# Patient Record
Sex: Female | Born: 1985
Health system: Southern US, Community
[De-identification: ages and names within clinical notes are randomized; demographics above are authoritative.]

## PROBLEM LIST (undated history)

## (undated) DIAGNOSIS — R4586 Emotional lability: Secondary | ICD-10-CM

## (undated) DIAGNOSIS — C73 Malignant neoplasm of thyroid gland: Secondary | ICD-10-CM

## (undated) DIAGNOSIS — R569 Unspecified convulsions: Secondary | ICD-10-CM

## (undated) DIAGNOSIS — C44721 Squamous cell carcinoma of skin of unspecified lower limb, including hip: Secondary | ICD-10-CM

## (undated) DIAGNOSIS — R490 Dysphonia: Secondary | ICD-10-CM

---
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
## 2010-08-16 ENCOUNTER — Inpatient Hospital Stay (HOSPITAL_COMMUNITY): Admission: AD | Admit: 2010-08-16 | Discharge: 2010-08-19 | Payer: Self-pay | Admitting: Obstetrics and Gynecology

## 2010-12-27 DIAGNOSIS — C44721 Squamous cell carcinoma of skin of unspecified lower limb, including hip: Secondary | ICD-10-CM

## 2010-12-27 HISTORY — DX: Squamous cell carcinoma of skin of unspecified lower limb, including hip: C44.721

## 2011-03-12 LAB — CBC
HCT: 28.3 % — ABNORMAL LOW (ref 36.0–46.0)
MCH: 28.3 pg (ref 26.0–34.0)
MCHC: 33 g/dL (ref 30.0–36.0)
MCV: 85.9 fL (ref 78.0–100.0)
Platelets: 164 10*3/uL (ref 150–400)
Platelets: 215 10*3/uL (ref 150–400)
RBC: 3.28 MIL/uL — ABNORMAL LOW (ref 3.87–5.11)
RDW: 14.2 % (ref 11.5–15.5)
RDW: 14.5 % (ref 11.5–15.5)
WBC: 9.9 10*3/uL (ref 4.0–10.5)

## 2011-03-12 LAB — URIC ACID: Uric Acid, Serum: 7.6 mg/dL — ABNORMAL HIGH (ref 2.4–7.0)

## 2011-03-12 LAB — COMPREHENSIVE METABOLIC PANEL
Alkaline Phosphatase: 222 U/L — ABNORMAL HIGH (ref 39–117)
BUN: 10 mg/dL (ref 6–23)
Calcium: 9.2 mg/dL (ref 8.4–10.5)
Creatinine, Ser: 0.78 mg/dL (ref 0.4–1.2)
Glucose, Bld: 73 mg/dL (ref 70–99)
Potassium: 4.2 mEq/L (ref 3.5–5.1)
Total Protein: 6 g/dL (ref 6.0–8.3)

## 2011-12-28 DIAGNOSIS — C73 Malignant neoplasm of thyroid gland: Secondary | ICD-10-CM

## 2011-12-28 HISTORY — DX: Malignant neoplasm of thyroid gland: C73

## 2012-02-21 ENCOUNTER — Ambulatory Visit (HOSPITAL_COMMUNITY)
Admission: RE | Admit: 2012-02-21 | Discharge: 2012-02-21 | Disposition: A | Discharge status: Home / Self Care | Payer: Medicaid Other | Source: Ambulatory Visit | Attending: Family Medicine | Admitting: Family Medicine

## 2012-02-21 ENCOUNTER — Other Ambulatory Visit: Payer: Self-pay | Admitting: Family Medicine

## 2012-02-21 DIAGNOSIS — E041 Nontoxic single thyroid nodule: Secondary | ICD-10-CM

## 2012-02-21 DIAGNOSIS — E042 Nontoxic multinodular goiter: Secondary | ICD-10-CM

## 2012-02-21 DIAGNOSIS — E049 Nontoxic goiter, unspecified: Secondary | ICD-10-CM | POA: Insufficient documentation

## 2012-02-23 ENCOUNTER — Ambulatory Visit
Admission: RE | Admit: 2012-02-23 | Discharge: 2012-02-23 | Disposition: A | Discharge status: Home / Self Care | Payer: Medicaid Other | Source: Ambulatory Visit | Attending: Family Medicine | Admitting: Family Medicine

## 2012-02-23 ENCOUNTER — Other Ambulatory Visit (HOSPITAL_COMMUNITY)
Admission: RE | Admit: 2012-02-23 | Discharge: 2012-02-23 | Disposition: A | Discharge status: Home / Self Care | Payer: Medicaid Other | Source: Ambulatory Visit | Attending: Interventional Radiology | Admitting: Interventional Radiology

## 2012-02-23 DIAGNOSIS — E042 Nontoxic multinodular goiter: Secondary | ICD-10-CM

## 2012-02-23 DIAGNOSIS — E049 Nontoxic goiter, unspecified: Secondary | ICD-10-CM | POA: Insufficient documentation

## 2012-03-13 ENCOUNTER — Encounter (HOSPITAL_COMMUNITY): Payer: Self-pay | Admitting: Pharmacy Technician

## 2012-03-14 ENCOUNTER — Encounter (HOSPITAL_COMMUNITY)
Admission: RE | Admit: 2012-03-14 | Discharge: 2012-03-14 | Disposition: A | Discharge status: Home / Self Care | Payer: Medicaid Other | Source: Ambulatory Visit | Attending: Otolaryngology | Admitting: Otolaryngology

## 2012-03-14 ENCOUNTER — Encounter (HOSPITAL_COMMUNITY): Payer: Self-pay

## 2012-03-14 HISTORY — DX: Unspecified convulsions: R56.9

## 2012-03-14 HISTORY — DX: Dysphonia: R49.0

## 2012-03-14 HISTORY — DX: Malignant neoplasm of thyroid gland: C73

## 2012-03-14 HISTORY — DX: Squamous cell carcinoma of skin of unspecified lower limb, including hip: C44.721

## 2012-03-14 HISTORY — DX: Emotional lability: R45.86

## 2012-03-14 LAB — SURGICAL PCR SCREEN: MRSA, PCR: NEGATIVE

## 2012-03-14 LAB — CBC
Hemoglobin: 13.5 g/dL (ref 12.0–15.0)
MCH: 30.7 pg (ref 26.0–34.0)
RBC: 4.4 MIL/uL (ref 3.87–5.11)

## 2012-03-14 LAB — HCG, SERUM, QUALITATIVE: Preg, Serum: NEGATIVE

## 2012-03-14 NOTE — Pre-Procedure Instructions (Signed)
[name]  03/14/2012   Your procedure is scheduled on:  Wednesday, March 27  Report to [facility] at 0630 AM.  Call this number if you have problems the morning of surgery: [phone]   Remember:   Do not eat food:After Midnight.  May have clear liquids: up to 4 Hours before arrival.  Clear liquids include soda, tea, black coffee, apple or grape juice, broth.  Take these medicines the morning of surgery with A SIP OF WATER: *Zoloft**   Do not wear jewelry, make-up or nail polish.  Do not wear lotions, powders, or perfumes. You may wear deodorant.  Do not shave 48 hours prior to surgery.  Do not bring valuables to the hospital.  Contacts, dentures or bridgework may not be worn into surgery.  Leave suitcase in the car. After surgery it may be brought to your room.  For patients admitted to the hospital, checkout time is 11:00 AM the day of discharge.   Patients discharged the day of surgery will not be allowed to drive home.     Special Instructions: CHG Shower Use Special Wash: 1/2 bottle night before surgery and 1/2 bottle morning of surgery.   Please read over the following fact sheets that you were given: Pain Booklet, Coughing and Deep Breathing, MRSA Information and Surgical Site Infection Prevention

## 2012-03-14 NOTE — H&P (Signed)
  Emily Smith is an 26 y.o. female.   Chief Complaint: Thyroid cancer  HPI: History of thyroid nodule, FNA positive for papillary carcinoma.  Past Medical History  Diagnosis Date  . Mood changes     takes zoloft  . Thyroid cancer 2013  . Squamous cell skin cancer, thigh 2012    left  . Seizures     as teenager ; none since  . Hoarseness     began 03/11/2012; associated w/ mild sore throat    No past surgical history on file.  Family History  Problem Relation Age of Onset  . Anesthesia problems Neg Hx    Social History:  reports that she has never smoked. She has never used smokeless tobacco. She reports that she drinks about .6 ounces of alcohol per week. She reports that she does not use illicit drugs.  Allergies:  Allergies  Allergen Reactions  . Sulfa Antibiotics Rash    Severe rash    No current facility-administered medications on file as of .   Medications Prior to Admission  Medication Sig Dispense Refill  . sertraline (ZOLOFT) 50 MG tablet Take 50 mg by mouth daily.        Results for orders placed during the hospital encounter of 03/14/12 (from the past 48 hour(s))  CBC     Status: Normal   Collection Time   03/14/12 10:33 AM      Component Value Range Comment   WBC 7.7  4.0 - 10.5 (K/uL)    RBC 4.40  3.87 - 5.11 (MIL/uL)    Hemoglobin 13.5  12.0 - 15.0 (g/dL)    HCT 13.0  86.5 - 78.4 (%)    MCV 90.0  78.0 - 100.0 (fL)    MCH 30.7  26.0 - 34.0 (pg)    MCHC 34.1  30.0 - 36.0 (g/dL)    RDW 69.6  29.5 - 28.4 (%)    Platelets 290  150 - 400 (K/uL)    No results found.  ROS: otherwise negative  There were no vitals taken for this visit.  PHYSICAL EXAM: Overall appearance:  Healthy appearing, in no distress Head:  Normocephalic, atraumatic. Ears: External auditory canals are clear; tympanic membranes are intact and the middle ears are free of any effusion. Nose: External nose is healthy in appearance. Internal nasal exam free of any lesions or  obstruction. Oral Cavity:  There are no mucosal lesions or masses identified. Oral Pharynx/Hypopharynx/Larynx: no signs of any mucosal lesions or masses identified. Vocal cords move normally. Neuro:  No identifiable neurologic deficits. Neck: Thyroid nodule, no other adenopathy.  Studies Reviewed: none    Assessment/Plan Recommend total thyroidectomy.  Orest Dygert 03/14/2012, 11:20 AM

## 2012-03-21 MED ORDER — CEFAZOLIN SODIUM 1-5 GM-% IV SOLN
1.0000 g | INTRAVENOUS | Status: AC
  Administered 2012-03-22: 1 g via INTRAVENOUS
  Filled 2012-03-21: qty 50

## 2012-03-22 ENCOUNTER — Encounter (HOSPITAL_COMMUNITY): Payer: Self-pay | Admitting: Anesthesiology

## 2012-03-22 ENCOUNTER — Ambulatory Visit (HOSPITAL_COMMUNITY): Payer: Medicaid Other | Admitting: Anesthesiology

## 2012-03-22 ENCOUNTER — Encounter (HOSPITAL_COMMUNITY)
Admission: RE | Disposition: A | Discharge status: Home / Self Care | Payer: Self-pay | Source: Ambulatory Visit | Attending: Otolaryngology

## 2012-03-22 ENCOUNTER — Ambulatory Visit (HOSPITAL_COMMUNITY)
Admission: RE | Admit: 2012-03-22 | Discharge: 2012-03-23 | Disposition: A | Discharge status: Home / Self Care | Payer: Medicaid Other | Source: Ambulatory Visit | Attending: Otolaryngology | Admitting: Otolaryngology

## 2012-03-22 ENCOUNTER — Encounter (HOSPITAL_COMMUNITY): Payer: Self-pay | Admitting: General Practice

## 2012-03-22 ENCOUNTER — Encounter (HOSPITAL_COMMUNITY): Payer: Self-pay | Admitting: *Deleted

## 2012-03-22 DIAGNOSIS — Z01812 Encounter for preprocedural laboratory examination: Secondary | ICD-10-CM | POA: Insufficient documentation

## 2012-03-22 DIAGNOSIS — C73 Malignant neoplasm of thyroid gland: Secondary | ICD-10-CM

## 2012-03-22 HISTORY — PX: THYROIDECTOMY: SHX17

## 2012-03-22 HISTORY — PX: OTHER SURGICAL HISTORY: SHX169

## 2012-03-22 HISTORY — PX: NODE DISSECTION: SHX5269

## 2012-03-22 LAB — CALCIUM, IONIZED: Calcium, Ion: 1.19 mmol/L (ref 1.12–1.32)

## 2012-03-22 SURGERY — THYROIDECTOMY
Anesthesia: General | Site: Neck | Wound class: Clean

## 2012-03-22 MED ORDER — ONDANSETRON HCL 4 MG/2ML IJ SOLN: 4.0000 mg | Freq: Four times a day (QID) | INTRAMUSCULAR | Status: DC | PRN

## 2012-03-22 MED ORDER — ROCURONIUM BROMIDE 100 MG/10ML IV SOLN
INTRAVENOUS | Status: DC | PRN
  Administered 2012-03-22: 50 mg via INTRAVENOUS

## 2012-03-22 MED ORDER — HYDROCODONE-ACETAMINOPHEN 5-325 MG PO TABS
1.0000 | ORAL_TABLET | ORAL | Status: DC | PRN
  Administered 2012-03-22 (×2): 2 via ORAL
  Filled 2012-03-22 (×2): qty 2

## 2012-03-22 MED ORDER — LIDOCAINE-EPINEPHRINE 1 %-1:100000 IJ SOLN
INTRAMUSCULAR | Status: DC | PRN
  Administered 2012-03-22: 2 mL

## 2012-03-22 MED ORDER — PROMETHAZINE HCL 25 MG RE SUPP
25.0000 mg | Freq: Four times a day (QID) | RECTAL | Status: DC | PRN
  Filled 2012-03-22: qty 1

## 2012-03-22 MED ORDER — LIDOCAINE HCL 4 % MT SOLN
OROMUCOSAL | Status: DC | PRN
  Administered 2012-03-22: 4 mL via TOPICAL

## 2012-03-22 MED ORDER — PHENYLEPHRINE HCL 10 MG/ML IJ SOLN
INTRAMUSCULAR | Status: DC | PRN
  Administered 2012-03-22: 80 ug via INTRAVENOUS
  Administered 2012-03-22: 40 ug via INTRAVENOUS

## 2012-03-22 MED ORDER — ONDANSETRON HCL 4 MG/2ML IJ SOLN
4.0000 mg | Freq: Four times a day (QID) | INTRAMUSCULAR | Status: DC | PRN
  Administered 2012-03-22: 4 mg via INTRAVENOUS
  Filled 2012-03-22: qty 2

## 2012-03-22 MED ORDER — HYDROCODONE-ACETAMINOPHEN 7.5-500 MG PO TABS: 1.0000 | ORAL_TABLET | Freq: Four times a day (QID) | ORAL | Status: AC | PRN

## 2012-03-22 MED ORDER — PROMETHAZINE HCL 25 MG RE SUPP: 25.0000 mg | Freq: Four times a day (QID) | RECTAL | Status: AC | PRN

## 2012-03-22 MED ORDER — HYDROMORPHONE HCL PF 1 MG/ML IJ SOLN
0.2500 mg | INTRAMUSCULAR | Status: DC | PRN
  Administered 2012-03-22 (×3): 0.25 mg via INTRAVENOUS

## 2012-03-22 MED ORDER — IBUPROFEN 400 MG PO TABS
400.0000 mg | ORAL_TABLET | Freq: Four times a day (QID) | ORAL | Status: DC | PRN
  Administered 2012-03-22: 400 mg via ORAL
  Filled 2012-03-22: qty 1

## 2012-03-22 MED ORDER — GLYCOPYRROLATE 0.2 MG/ML IJ SOLN
INTRAMUSCULAR | Status: DC | PRN
  Administered 2012-03-22: 0.6 mg via INTRAVENOUS

## 2012-03-22 MED ORDER — NEOSTIGMINE METHYLSULFATE 1 MG/ML IJ SOLN
INTRAMUSCULAR | Status: DC | PRN
  Administered 2012-03-22: 4 mg via INTRAVENOUS

## 2012-03-22 MED ORDER — LEVOTHYROXINE SODIUM 100 MCG PO TABS: 100.0000 ug | ORAL_TABLET | Freq: Every day | ORAL | Status: DC

## 2012-03-22 MED ORDER — DEXTROSE-NACL 5-0.9 % IV SOLN
INTRAVENOUS | Status: DC
  Administered 2012-03-22: 75 mL/h via INTRAVENOUS

## 2012-03-22 MED ORDER — STERILE WATER FOR IRRIGATION IR SOLN
Status: DC | PRN
  Administered 2012-03-22: 1000 mL

## 2012-03-22 MED ORDER — SERTRALINE HCL 50 MG PO TABS
50.0000 mg | ORAL_TABLET | Freq: Every day | ORAL | Status: DC
  Administered 2012-03-22: 50 mg via ORAL
  Filled 2012-03-22 (×3): qty 1

## 2012-03-22 MED ORDER — MIDAZOLAM HCL 5 MG/5ML IJ SOLN
INTRAMUSCULAR | Status: DC | PRN
  Administered 2012-03-22: 2 mg via INTRAVENOUS

## 2012-03-22 MED ORDER — LEVOTHYROXINE SODIUM 100 MCG PO TABS
100.0000 ug | ORAL_TABLET | Freq: Every day | ORAL | Status: DC
  Administered 2012-03-23: 100 ug via ORAL
  Filled 2012-03-22 (×3): qty 1

## 2012-03-22 MED ORDER — 0.9 % SODIUM CHLORIDE (POUR BTL) OPTIME
TOPICAL | Status: DC | PRN
  Administered 2012-03-22: 1000 mL

## 2012-03-22 MED ORDER — PROPOFOL 10 MG/ML IV EMUL
INTRAVENOUS | Status: DC | PRN
  Administered 2012-03-22: 200 mg via INTRAVENOUS

## 2012-03-22 MED ORDER — ONDANSETRON HCL 4 MG/2ML IJ SOLN
INTRAMUSCULAR | Status: DC | PRN
  Administered 2012-03-22: 4 mg via INTRAVENOUS

## 2012-03-22 MED ORDER — PROMETHAZINE HCL 25 MG PO TABS
25.0000 mg | ORAL_TABLET | Freq: Four times a day (QID) | ORAL | Status: DC | PRN
  Administered 2012-03-22: 25 mg via ORAL
  Filled 2012-03-22: qty 1

## 2012-03-22 MED ORDER — ARTIFICIAL TEARS OP OINT
TOPICAL_OINTMENT | OPHTHALMIC | Status: DC | PRN
  Administered 2012-03-22: 1 via OPHTHALMIC

## 2012-03-22 MED ORDER — HYDROCODONE-ACETAMINOPHEN 7.5-500 MG PO TABS: 1.0000 | ORAL_TABLET | Freq: Four times a day (QID) | ORAL | Status: DC | PRN

## 2012-03-22 MED ORDER — MORPHINE SULFATE 2 MG/ML IJ SOLN
2.0000 mg | INTRAMUSCULAR | Status: DC | PRN
  Administered 2012-03-22 (×2): 1 mg via INTRAVENOUS
  Filled 2012-03-22 (×2): qty 1

## 2012-03-22 MED ORDER — FENTANYL CITRATE 0.05 MG/ML IJ SOLN
INTRAMUSCULAR | Status: DC | PRN
  Administered 2012-03-22: 100 ug via INTRAVENOUS
  Administered 2012-03-22: 50 ug via INTRAVENOUS

## 2012-03-22 MED ORDER — LACTATED RINGERS IV SOLN
INTRAVENOUS | Status: DC | PRN
  Administered 2012-03-22 (×2): via INTRAVENOUS

## 2012-03-22 SURGICAL SUPPLY — 48 items
APPLIER CLIP 9.375 SM OPEN (CLIP)
ATTRACTOMAT 16X20 MAGNETIC DRP (DRAPES) ×3 IMPLANT
CANISTER SUCTION 2500CC (MISCELLANEOUS) ×3 IMPLANT
CLEANER TIP ELECTROSURG 2X2 (MISCELLANEOUS) ×3 IMPLANT
CLIP APPLIE 9.375 SM OPEN (CLIP) IMPLANT
CLOTH BEACON ORANGE TIMEOUT ST (SAFETY) ×3 IMPLANT
CONT SPEC 4OZ CLIKSEAL STRL BL (MISCELLANEOUS) IMPLANT
CORDS BIPOLAR (ELECTRODE) ×3 IMPLANT
COVER SURGICAL LIGHT HANDLE (MISCELLANEOUS) ×3 IMPLANT
DECANTER SPIKE VIAL GLASS SM (MISCELLANEOUS) ×3 IMPLANT
DERMABOND ADVANCED (GAUZE/BANDAGES/DRESSINGS) ×1
DERMABOND ADVANCED .7 DNX12 (GAUZE/BANDAGES/DRESSINGS) ×2 IMPLANT
DRAIN JACKSON RD 7FR 3/32 (WOUND CARE) IMPLANT
DRAIN SNY 10 ROU (WOUND CARE) ×3 IMPLANT
ELECT COATED BLADE 2.86 ST (ELECTRODE) ×3 IMPLANT
ELECT REM PT RETURN 9FT ADLT (ELECTROSURGICAL) ×3
ELECTRODE REM PT RTRN 9FT ADLT (ELECTROSURGICAL) ×2 IMPLANT
EVACUATOR SILICONE 100CC (DRAIN) ×3 IMPLANT
GAUZE SPONGE 4X4 16PLY XRAY LF (GAUZE/BANDAGES/DRESSINGS) ×6 IMPLANT
GLOVE BIO SURGEON STRL SZ 6.5 (GLOVE) ×3 IMPLANT
GLOVE BIOGEL PI IND STRL 7.0 (GLOVE) ×2 IMPLANT
GLOVE BIOGEL PI INDICATOR 7.0 (GLOVE) ×1
GLOVE ECLIPSE 6.5 STRL STRAW (GLOVE) ×3 IMPLANT
GLOVE ECLIPSE 7.5 STRL STRAW (GLOVE) ×3 IMPLANT
GLOVE SURG SS PI 6.5 STRL IVOR (GLOVE) ×3 IMPLANT
GOWN STRL NON-REIN LRG LVL3 (GOWN DISPOSABLE) ×9 IMPLANT
KIT BASIN OR (CUSTOM PROCEDURE TRAY) ×3 IMPLANT
KIT ROOM TURNOVER OR (KITS) ×3 IMPLANT
NEEDLE 27GAX1X1/2 (NEEDLE) ×3 IMPLANT
NS IRRIG 1000ML POUR BTL (IV SOLUTION) ×3 IMPLANT
PAD ARMBOARD 7.5X6 YLW CONV (MISCELLANEOUS) ×6 IMPLANT
PENCIL FOOT CONTROL (ELECTRODE) ×3 IMPLANT
SPECIMEN JAR MEDIUM (MISCELLANEOUS) IMPLANT
SPONGE INTESTINAL PEANUT (DISPOSABLE) IMPLANT
STAPLER VISISTAT 35W (STAPLE) ×3 IMPLANT
SUT CHROMIC 3 0 SH 27 (SUTURE) IMPLANT
SUT CHROMIC 4 0 PS 2 18 (SUTURE) ×3 IMPLANT
SUT ETHILON 3 0 PS 1 (SUTURE) IMPLANT
SUT ETHILON 5 0 P 3 18 (SUTURE) ×1
SUT NYLON ETHILON 5-0 P-3 1X18 (SUTURE) ×2 IMPLANT
SUT SILK 2 0 FS (SUTURE) ×3 IMPLANT
SUT SILK 3 0 REEL (SUTURE) IMPLANT
SUT SILK 4 0 REEL (SUTURE) ×9 IMPLANT
TOWEL OR 17X24 6PK STRL BLUE (TOWEL DISPOSABLE) ×3 IMPLANT
TOWEL OR 17X26 10 PK STRL BLUE (TOWEL DISPOSABLE) ×3 IMPLANT
TOWEL OR NON WOVEN STRL DISP B (DISPOSABLE) ×3 IMPLANT
TRAY ENT MC OR (CUSTOM PROCEDURE TRAY) ×3 IMPLANT
WATER STERILE IRR 1000ML POUR (IV SOLUTION) ×3 IMPLANT

## 2012-03-22 NOTE — Anesthesia Preprocedure Evaluation (Signed)
Anesthesia Evaluation  Patient identified by MRN, date of birth, ID band Patient awake    Reviewed: Allergy & Precautions, H&P , NPO status , Patient's Chart, lab work & pertinent test results  Airway Mallampati: II  Neck ROM: full    Dental   Pulmonary          Cardiovascular     Neuro/Psych Seizures -,     GI/Hepatic   Endo/Other    Renal/GU      Musculoskeletal   Abdominal   Peds  Hematology   Anesthesia Other Findings   Reproductive/Obstetrics                           Anesthesia Physical Anesthesia Plan  ASA: II  Anesthesia Plan: General   Post-op Pain Management:    Induction: Intravenous  Airway Management Planned: Oral ETT  Additional Equipment:   Intra-op Plan:   Post-operative Plan: Extubation in OR  Informed Consent: I have reviewed the patients History and Physical, chart, labs and discussed the procedure including the risks, benefits and alternatives for the proposed anesthesia with the patient or authorized representative who has indicated his/her understanding and acceptance.     Plan Discussed with: CRNA and Surgeon  Anesthesia Plan Comments:         Anesthesia Quick Evaluation

## 2012-03-22 NOTE — Transfer of Care (Signed)
Immediate Anesthesia Transfer of Care Note  Patient: Emily Smith  Procedure(s) Performed: Procedure(s) (LRB): THYROIDECTOMY (Bilateral) NODE DISSECTION (N/A)  Patient Location: PACU  Anesthesia Type: General  Level of Consciousness: awake, alert  and oriented  Airway & Oxygen Therapy: Patient Spontanous Breathing and Patient connected to nasal cannula oxygen  Post-op Assessment: Report given to PACU RN and Patient moving all extremities  Post vital signs: Reviewed  Complications: No apparent anesthesia complications

## 2012-03-22 NOTE — Progress Notes (Signed)
Report given to elise rn as caregiver 

## 2012-03-22 NOTE — Preoperative (Signed)
Beta Blockers   Reason not to administer Beta Blockers:Not Applicable 

## 2012-03-22 NOTE — Discharge Instructions (Signed)
You may shower and use soap and water. Do not using the cream or ointment on the incision.

## 2012-03-22 NOTE — Op Note (Signed)
OPERATIVE REPORT  DATE OF SURGERY: 03/22/2012  PATIENT:  Emily Smith,  26 y.o. female  PRE-OPERATIVE DIAGNOSIS:  Thyroid Cancer  POST-OPERATIVE DIAGNOSIS:  Thyroid Cancer  PROCEDURE:  Procedure(s): THYROIDECTOMY NODE DISSECTION  SURGEON:  Susy Frizzle, MD  ASSISTANTS: Beckey Rutter, PA   ANESTHESIA:   general  EBL:  20 ml  DRAINS: (10 [place]) Jackson-Pratt drain(s) with closed bulb suction in the Neck   LOCAL MEDICATIONS USED:  XYLOCAINE   SPECIMEN:  Source of Specimen:  Total thyroidectomy and a central node compartments dissection  COUNTS:  YES  PROCEDURE DETAILS: Patient was taken to the operating room and placed on the operating table in the supine position. Following induction of general endotracheal anesthesia a shoulder roll was placed  beneath the shoulder blades. Cervical landmarks were identified. A transverse incision was outlined about 2 fingerbreadths above the sternal notch. A marking pen was used to mark the incision. Xylocaine with epinephrine was infiltrated and a 15 scalpel was used to incise the skin and subcutaneous tissue. Superficial fascia was divided. A subplatysmal flap was elevated superiorly and inferiorly allowing sufficient exposure of the thyroid gland that was palpable. The midline fascia was divided using electrocautery. Self-retaining retractors were used one on each side. The thyroid gland was identified. The right side was dissected first. The superior fold was brought inferiorly and toward the left side as the superior pole vessels were separately identified, ligated between clamps and divided. As the gland was brought forward, additional vessels were ligated in a similar fashion. The recurrent nerve was identified. The superior parathyroid was identified and preserved with its blood supply. The middle thyroid gland was then ligated as well. The inferior vasculature was identified and ligated in a similar fashion. The gland was brought  forward off of the trachea as the ligament of Allyson Sabal was divided. The left side was then dissected in a similar fashion. The right side had a 1-1/2 cm firm nodule close to the isthmus. The left side had a 2-1/2 cm nodule further laterally and slightly superiorly in the gland. The left side was dissected in a very similar fashion and the recurrent nerve was identified on the left. Parathyroid was not separately identified. The specimen was removed and sent for pathologic evaluation. A single silk suture was used to mark the left lobe.  Central node dissection: There are multiple palpable lymph nodes in the central node compartment. This was dissected by identifying the recurrent nerve following it down towards the thoracic inlet, and removing all the soft tissue between the nerves. The dissection was completed down to the trachea and the specimen was removed and sent for pathologic evaluation. The wound was irrigated with saline. Hemostasis was completed using additional ties and bipolar cautery as needed. The drain was left in the wound exiting through the right side of the incision and secured in place with a nylon suture. The midline fascia was reapproximated with chromic suture. The platysma layer and a subcuticular layer were also reapproximated with chromic suture. Dermabond was used on the skin. The patient was then awakened, extubated and transferred to recovery in stable condition.   PATIENT DISPOSITION:  PACU - hemodynamically stable.

## 2012-03-22 NOTE — Anesthesia Postprocedure Evaluation (Signed)
Anesthesia Post Note  Patient: Emily Smith  Procedure(s) Performed: Procedure(s) (LRB): THYROIDECTOMY (Bilateral) NODE DISSECTION (N/A)  Anesthesia type: General  Patient location: PACU  Post pain: Pain level controlled and Adequate analgesia  Post assessment: Post-op Vital signs reviewed, Patient's Cardiovascular Status Stable, Respiratory Function Stable, Patent Airway and Pain level controlled  Last Vitals:  Filed Vitals:   03/22/12 1145  BP: 123/68  Pulse: 65  Temp: 36.6 C  Resp: 16    Post vital signs: Reviewed and stable  Level of consciousness: awake, alert  and oriented  Complications: No apparent anesthesia complications

## 2012-03-22 NOTE — Interval H&P Note (Signed)
History and Physical Interval Note:  03/22/2012 8:22 AM  Emily Smith  has presented today for surgery, with the diagnosis of thyroid cancer  The various methods of treatment have been discussed with the patient and family. After consideration of risks, benefits and other options for treatment, the patient has consented to  Procedure(s) (LRB): THYROIDECTOMY (Bilateral) as a surgical intervention .  The patients' history has been reviewed, patient examined, no change in status, stable for surgery.  I have reviewed the patients' chart and labs.  Questions were answered to the patient's satisfaction.     Kandance Yano

## 2012-03-22 NOTE — Progress Notes (Signed)
She is doing well. She is taking fluids but having a slight cough with them. Her voice is very weak. No breathing problems. Her ionized calcium was 1.19 Her wound looks excellent. The drain is working well. No evidence of infection or hematoma. Continued care and followup in the morning by Dr. Pollyann Kennedy.

## 2012-03-23 ENCOUNTER — Encounter (HOSPITAL_COMMUNITY): Payer: Self-pay | Admitting: Otolaryngology

## 2012-03-23 LAB — CALCIUM, IONIZED
Calcium, Ion: 1.11 mmol/L — ABNORMAL LOW (ref 1.12–1.32)
Calcium, Ion: 1.11 mmol/L — ABNORMAL LOW (ref 1.12–1.32)
Calcium, Ion: 1.19 mmol/L (ref 1.12–1.32)

## 2012-03-23 NOTE — Progress Notes (Signed)
Patient ID: Emily Smith, female   DOB: Jun 05, 1986, 26 y.o.   MRN: [id number] Subjective: She is doing well, able to swallow, but her voice is very weak and she has a weak cough. Pain is under control. She denies any cramping or twitching and no perioral tingling.  Objective: Vital signs in last 24 hours: Temp:  [97.8 F (36.6 C)-99 F (37.2 C)] 98.4 F (36.9 C) (03/28 0554) Pulse Rate:  [64-86] 72  (03/28 0554) Resp:  [13-22] 18  (03/28 0554) BP: (91-127)/(51-81) 103/59 mmHg (03/28 0554) SpO2:  [95 %-100 %] 95 % (03/28 0554) Weight:  [154 lb 9.6 oz (70.126 kg)] 154 lb 9.6 oz (70.126 kg) (03/27 1604) Weight change:  Last BM Date: 03/22/12  Intake/Output from previous day: 03/27 0701 - 03/28 0700 In: 2116.3 [P.O.:480; I.V.:1636.3] Out: 660 [Urine:600; Drains:45; Blood:15] Intake/Output this shift:    PHYSICAL EXAM: Neck looks excellent, drain removed. Voice very breathy. Cough is weak. Kvostek's negative.  Lab Results: No results found for this basename: WBC:2,HGB:2,HCT:2,PLT:2 in the last 72 hours BMET No results found for this basename: NA:2,K:2,CL:2,CO2:2,GLUCOSE:2,BUN:2,CREATININE:2,CALCIUM:2 in the last 72 hours  Studies/Results: No results found.  Medications: I have reviewed the patient's current medications.  Assessment/Plan: Post op day one, doing well. Likely recurrent nerve palsy, should be temporary. Calcium dropped a little. Awaiting the most recent one from this morning. No symptoms. Will discharge home if calcium stable.  LOS: 1 day   Tonia Avino 03/23/2012, 8:30 AM

## 2012-03-23 NOTE — Progress Notes (Signed)
Patient discharged home, instructions given and verbalized understanding, escorted by family

## 2012-03-23 NOTE — Progress Notes (Signed)
Patient complaining of difficulty swallowing thin liquids, patient stated she is not in pain and that she swallowed well with apple sauce. Noted patient coughing after trying to drink any fluids, MD paged thru clinic number, still waiting for a call.

## 2012-03-23 NOTE — Discharge Summary (Signed)
  Physician Discharge Summary  Patient ID: Emily Smith MRN: [id number] DOB/AGE: Jun 03, 1986 [age] y.o.  Admit date: 03/22/2012 Discharge date: 03/23/2012  Admission Diagnoses:thyroid cancer  Discharge Diagnoses:  Active Problems:  * No active hospital problems. *    Discharged Condition: good  Hospital Course: Total thyroidectomy, central node dissection. 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 Calcium stable.   Consults: None  Significant Diagnostic Studies: labs: calcium levels  Treatments: surgery: thyroidectomy and nodal dissection  Discharge Exam: Blood pressure 103/59, pulse 72, temperature 98.4 F (36.9 C), temperature source Oral, resp. rate 18, height 5\' 6"  (1.676 m), weight 154 lb 9.6 oz (70.126 kg), last menstrual period 12/10/2009, SpO2 95.00%. PHYSICAL EXAM: JP removed, no swelling. Voice weak.  Disposition:   Discharge Orders    Future Orders Please Complete By Expires   Diet - low sodium heart healthy      Comments:   Minimize thin liquids. It might help to tuck your chin against your chest when you drink.   Increase activity slowly        Medication List  As of 03/23/2012  1:07 PM   TAKE these medications         HYDROcodone-acetaminophen 7.5-500 MG per tablet   Commonly known as: LORTAB   Take 1 tablet by mouth every 6 (six) hours as needed for pain.      levothyroxine 100 MCG tablet   Commonly known as: SYNTHROID, LEVOTHROID   Take 1 tablet (100 mcg total) by mouth daily.      promethazine 25 MG suppository   Commonly known as: PHENERGAN   Place 1 suppository (25 mg total) rectally every 6 (six) hours as needed for nausea.      sertraline 50 MG tablet   Commonly known as: ZOLOFT   Take 50 mg by mouth daily.           Follow-up Information    Follow up with Serena Colonel, MD. Call in 1 week.   Contact information:   [address], [address] [place]  [address] [phone]          Signed: Serena Colonel 03/23/2012, 1:07 PM

## 2012-05-24 ENCOUNTER — Other Ambulatory Visit: Payer: Self-pay | Admitting: Endocrinology

## 2012-05-24 DIAGNOSIS — C73 Malignant neoplasm of thyroid gland: Secondary | ICD-10-CM

## 2012-06-05 ENCOUNTER — Encounter (HOSPITAL_COMMUNITY)
Admission: RE | Admit: 2012-06-05 | Discharge: 2012-06-05 | Disposition: A | Discharge status: Home / Self Care | Payer: Medicaid Other | Source: Ambulatory Visit | Attending: Endocrinology | Admitting: Endocrinology

## 2012-06-05 ENCOUNTER — Inpatient Hospital Stay (HOSPITAL_COMMUNITY): Admission: RE | Admit: 2012-06-05 | Payer: Medicaid Other | Source: Ambulatory Visit

## 2012-06-05 DIAGNOSIS — C73 Malignant neoplasm of thyroid gland: Secondary | ICD-10-CM | POA: Insufficient documentation

## 2012-06-05 MED ORDER — THYROTROPIN ALFA 1.1 MG IM SOLR
0.9000 mg | INTRAMUSCULAR | Status: DC
  Filled 2012-06-05: qty 0.9

## 2012-06-06 ENCOUNTER — Ambulatory Visit (HOSPITAL_COMMUNITY)
Admission: RE | Admit: 2012-06-06 | Discharge: 2012-06-06 | Disposition: A | Discharge status: Home / Self Care | Payer: Medicaid Other | Source: Ambulatory Visit | Attending: Endocrinology | Admitting: Endocrinology

## 2012-06-06 DIAGNOSIS — E0789 Other specified disorders of thyroid: Secondary | ICD-10-CM | POA: Insufficient documentation

## 2012-06-06 DIAGNOSIS — C73 Malignant neoplasm of thyroid gland: Secondary | ICD-10-CM | POA: Insufficient documentation

## 2012-06-06 MED ORDER — THYROTROPIN ALFA 1.1 MG IM SOLR
0.9000 mg | INTRAMUSCULAR | Status: DC
  Filled 2012-06-06: qty 0.9

## 2012-06-07 ENCOUNTER — Encounter (HOSPITAL_COMMUNITY)
Admission: RE | Admit: 2012-06-07 | Discharge: 2012-06-07 | Disposition: A | Discharge status: Home / Self Care | Payer: Medicaid Other | Source: Ambulatory Visit | Attending: Endocrinology | Admitting: Endocrinology

## 2012-06-07 LAB — HCG, SERUM, QUALITATIVE: Preg, Serum: NEGATIVE

## 2012-06-07 MED ORDER — SODIUM IODIDE I 131 CAPSULE
132.3000 | Freq: Once | INTRAVENOUS | Status: AC | PRN
  Administered 2012-06-07: 132.3 via ORAL

## 2012-06-12 ENCOUNTER — Encounter (HOSPITAL_COMMUNITY): Payer: Medicaid Other

## 2012-06-14 ENCOUNTER — Encounter (HOSPITAL_COMMUNITY): Payer: Self-pay

## 2012-06-14 ENCOUNTER — Encounter (HOSPITAL_COMMUNITY)
Admission: RE | Admit: 2012-06-14 | Discharge: 2012-06-14 | Disposition: A | Discharge status: Home / Self Care | Payer: Medicaid Other | Source: Ambulatory Visit | Attending: Endocrinology | Admitting: Endocrinology

## 2012-06-14 DIAGNOSIS — C73 Malignant neoplasm of thyroid gland: Secondary | ICD-10-CM | POA: Insufficient documentation

## 2013-05-02 ENCOUNTER — Other Ambulatory Visit (INDEPENDENT_AMBULATORY_CARE_PROVIDER_SITE_OTHER): Payer: Medicaid Other

## 2013-05-02 DIAGNOSIS — C73 Malignant neoplasm of thyroid gland: Secondary | ICD-10-CM

## 2013-05-02 LAB — THYROID PANEL WITH TSH: Free Thyroxine Index: 5 — ABNORMAL HIGH (ref 1.0–3.9)

## 2013-05-02 NOTE — Addendum Note (Signed)
Addended by: Orma Render F on: 05/02/2013 02:37 PM   Modules accepted: Orders

## 2013-05-02 NOTE — Progress Notes (Signed)
Patient came in for labs only.

## 2013-05-03 LAB — THYROGLOBULIN ANTIBODY: Thyroglobulin Ab: <20 U/mL (ref <40.0)

## 2013-06-23 IMAGING — US US SOFT TISSUE HEAD/NECK
1 series · 14 of 25 positions shown · non-contrast
Comparison: None.

CLINICAL DATA: Thyroid nodule

THYROID ULTRASOUND
TECHNIQUE: Ultrasound examination of the thyroid gland and adjacent
soft tissues was performed.

[Series 1: us soft tissue head/neck · 0.08mm/px · 14 of 38 slices shown]
[im 1/38]
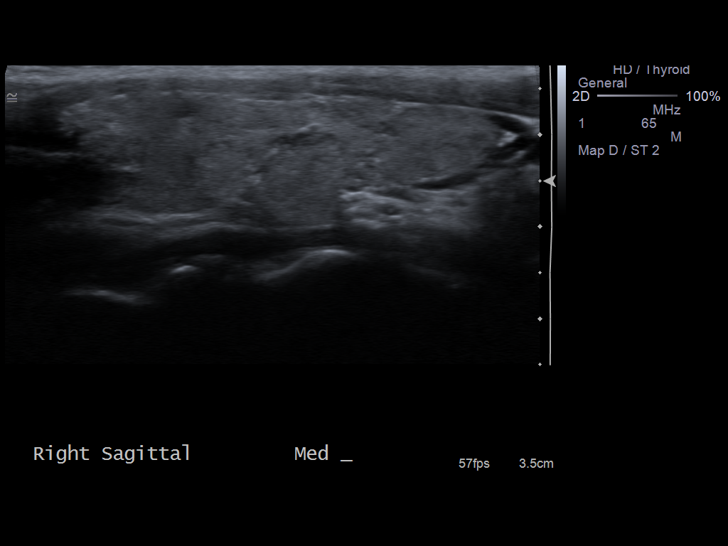
[im 4/38]
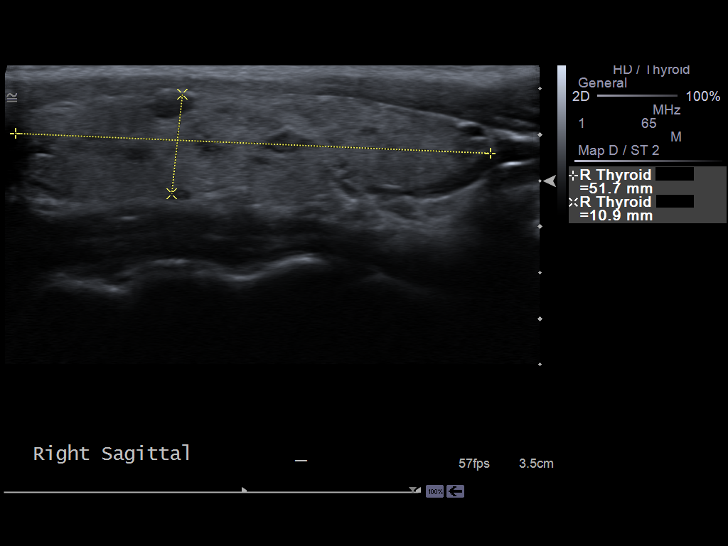
[im 7/38]
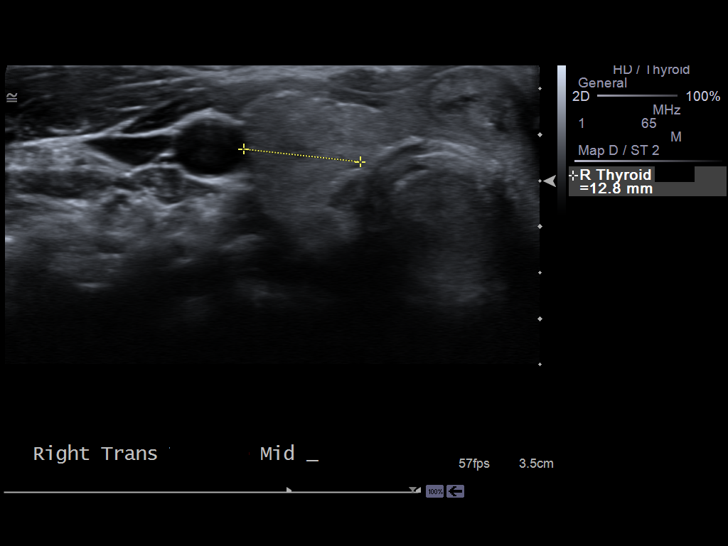
[im 10/38]
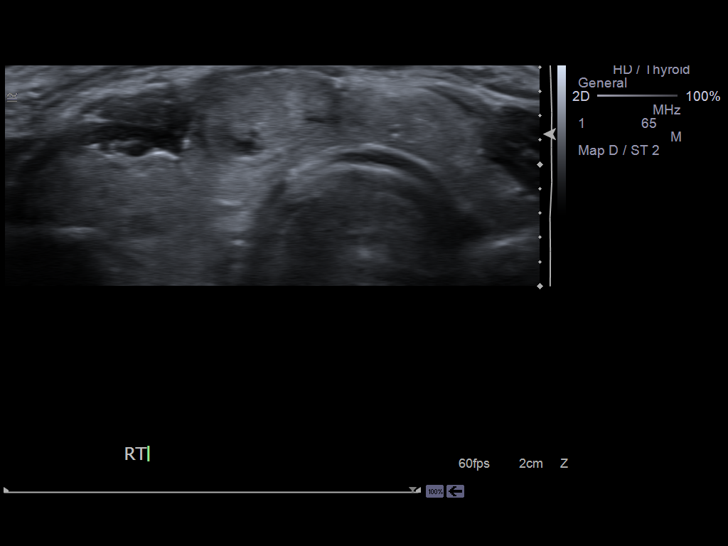
[im 13/38]
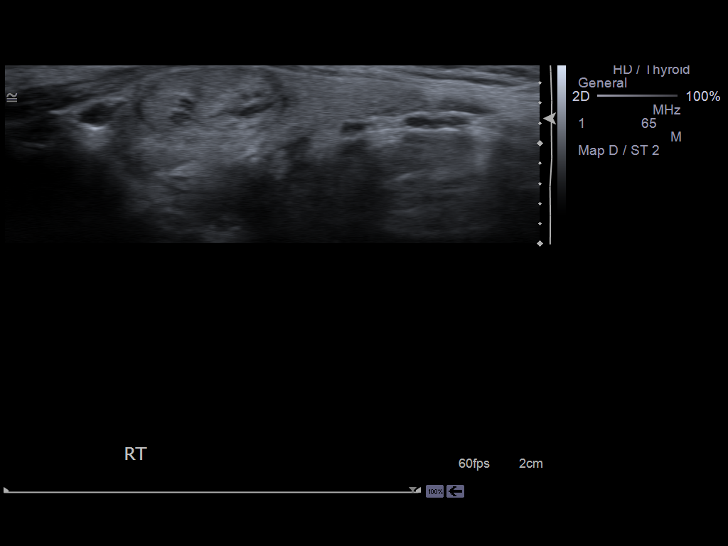
[im 14/38]
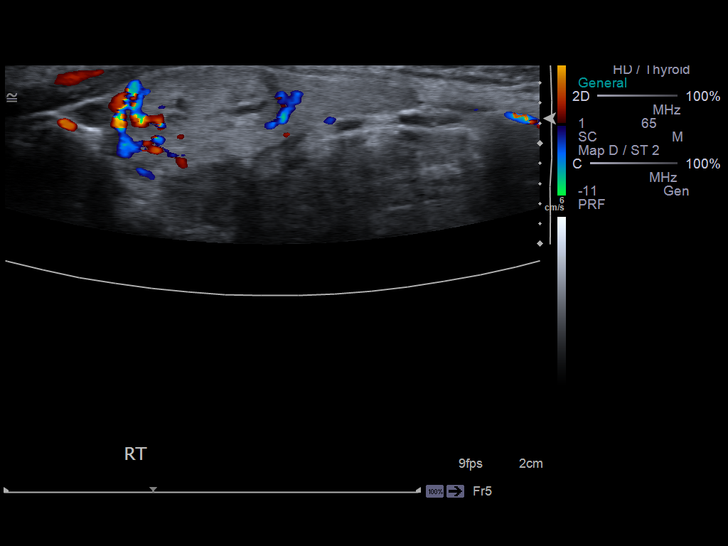
[im 17/38]
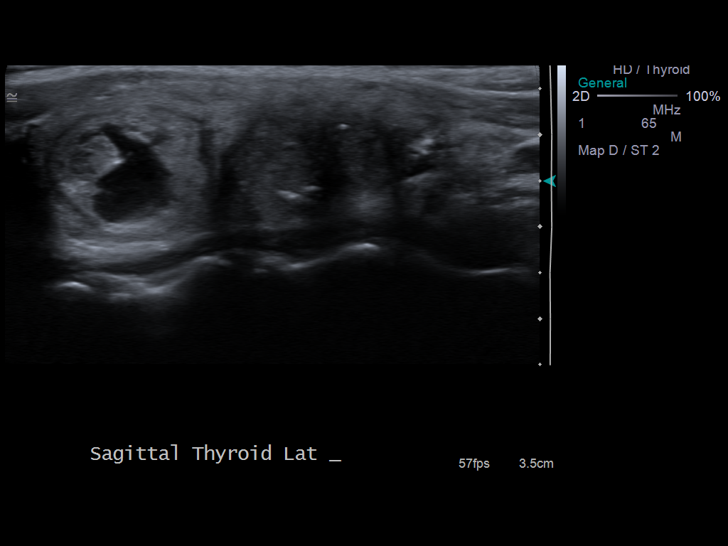
[im 21/38]
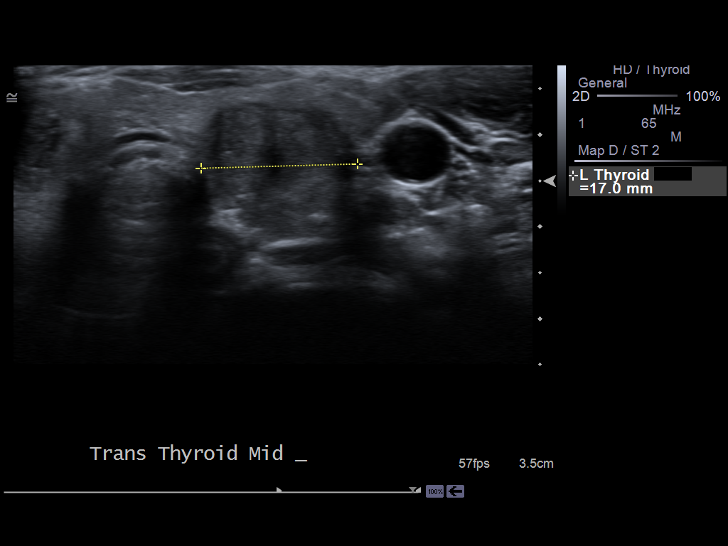
[im 24/38]
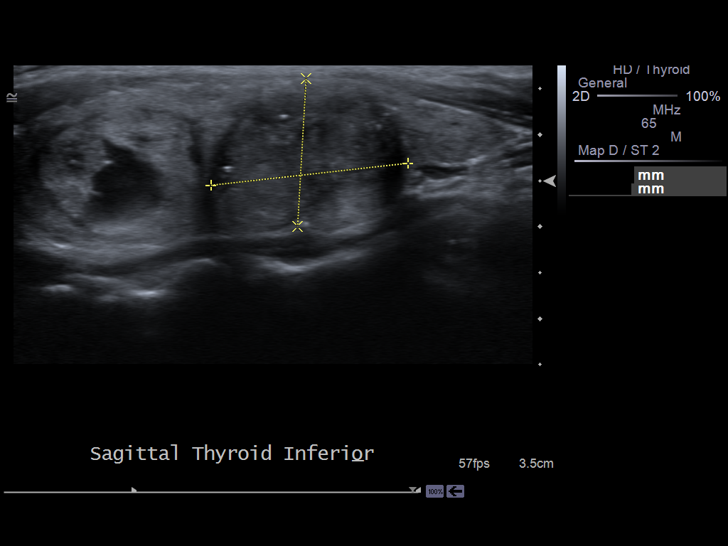
[im 25/38]
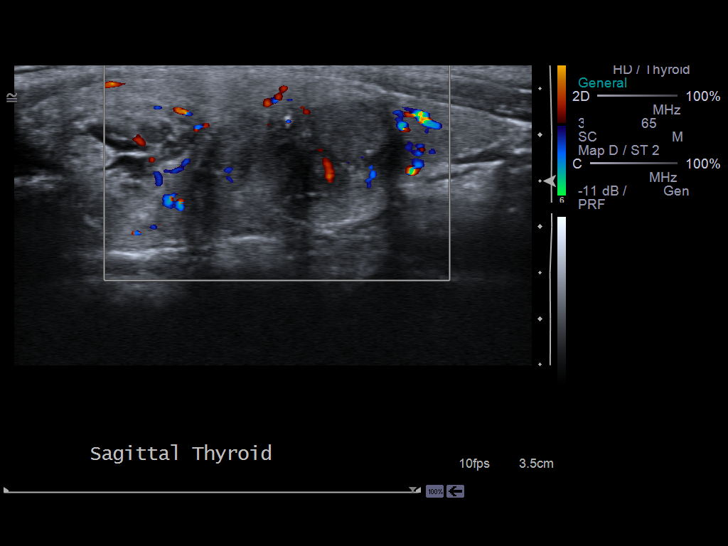
[im 28/38]
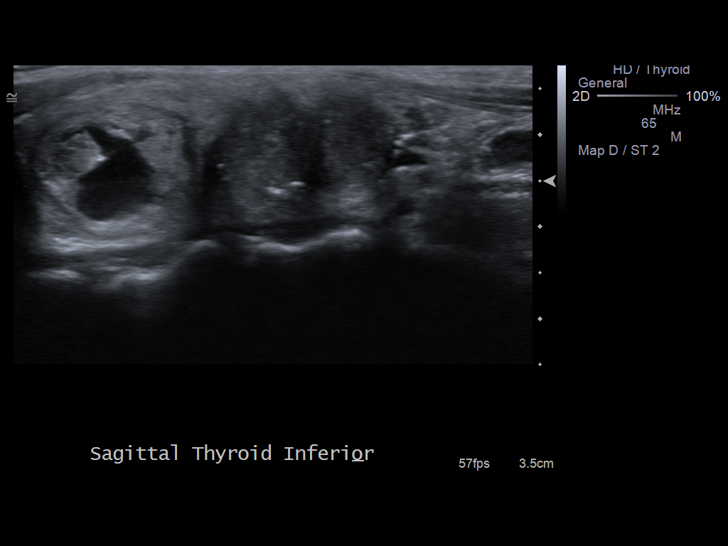
[im 31/38]
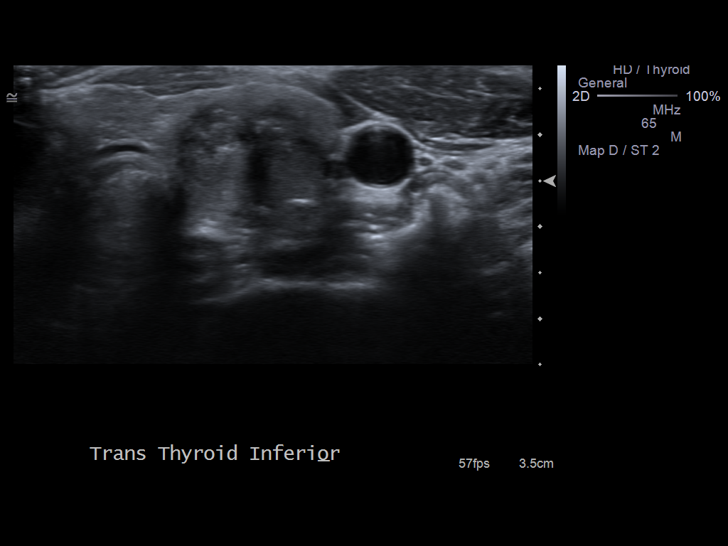
[im 34/38]
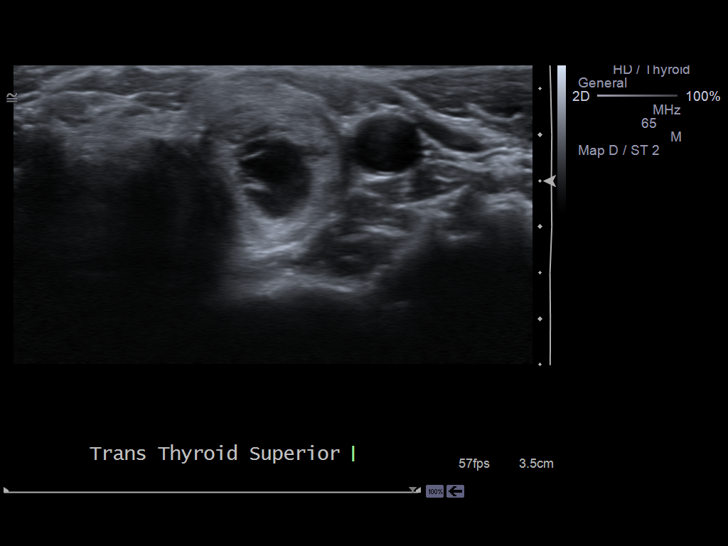
[im 38/38]
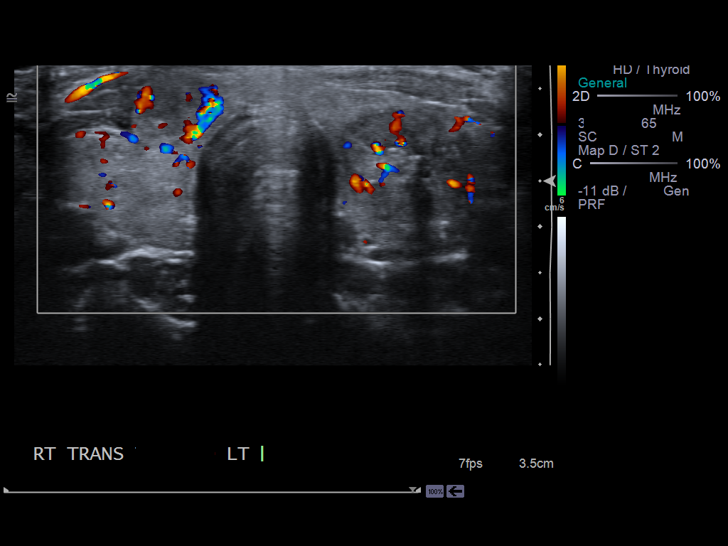

[14 of 25 positions shown; findings below may reference images not displayed]

FINDINGS: Right thyroid lobe:  Measures 5.2 x 1.1 x 1.3 cm.
Left thyroid lobe:  Measures 5.2 x 1.7 x 1.7 cm.
Isthmus:  Measures 4 mm in thickness.

Focal nodules:  11 x 6 x 14 mm predominantly solid right isthmus
nodule.  1.8 x 1.3 x 1.5 cm mixed cystic/solid left upper thyroid
nodule.  2.2 x 1.6 x 1.6 cm predominantly solid left lower thyroid
nodule with suspected microcalcifications.

Lymphadenopathy:  None visualized.
IMPRESSION: 2.2 cm predominantly solid left lower thyroid nodule with suspected
microcalcifications. Ultrasound-guided biopsy is suggested.

Additional thyroid nodules as above.

Above recommendation follows the consensus statement:  Management
of Thyroid Nodules Detected as US:  Society of [employer]iologists in
800.

## 2013-07-04 ENCOUNTER — Other Ambulatory Visit (HOSPITAL_COMMUNITY): Payer: Self-pay | Admitting: Endocrinology

## 2013-07-04 DIAGNOSIS — C73 Malignant neoplasm of thyroid gland: Secondary | ICD-10-CM

## 2013-07-06 ENCOUNTER — Ambulatory Visit (INDEPENDENT_AMBULATORY_CARE_PROVIDER_SITE_OTHER): Payer: 59 | Admitting: Pulmonary Disease

## 2013-07-06 ENCOUNTER — Encounter: Payer: Self-pay | Admitting: Pulmonary Disease

## 2013-07-06 VITALS — BP 104/62 | HR 63 | Temp 98.2°F | Ht 66.0 in | Wt 149.2 lb

## 2013-07-06 DIAGNOSIS — R0989 Other specified symptoms and signs involving the circulatory and respiratory systems: Secondary | ICD-10-CM

## 2013-07-06 DIAGNOSIS — R0600 Dyspnea, unspecified: Secondary | ICD-10-CM | POA: Insufficient documentation

## 2013-07-06 DIAGNOSIS — R0609 Other forms of dyspnea: Secondary | ICD-10-CM

## 2013-07-06 MED ORDER — ALBUTEROL SULFATE HFA 108 (90 BASE) MCG/ACT IN AERS: 2.0000 | INHALATION_SPRAY | Freq: Four times a day (QID) | RESPIRATORY_TRACT | Status: DC | PRN

## 2013-07-06 NOTE — Patient Instructions (Addendum)
Trial of albuterol inhaler 2 puffs as needed - call in 1 week to report I will discuss with Dr Pollyann Kennedy

## 2013-07-06 NOTE — Progress Notes (Signed)
Subjective:    Patient ID: Emily Smith, female    DOB: [birth year], 27 y.o.   MRN: [id number]  HPI Endocrine- [facility] - Pollyann Kennedy  27 year old never smoker referred for evaluation of dyspnea on exertion . She reports dyspnea with activity such as walking, exercise and even simply talking. This is relieved by resting for a couple of minutes. She reports change in her voice right after her total thyroidectomy for what turned out to be papillary thyroid cancer with capsular and extrathyroidal invasion in March 2013. She is maintained on 50 mcg of levothyroxine daily and her scans have been negative. She reports a raspy voice and choking with liquids. She reports constant throat clearing and a chronic cough which seems to emanate from her upper airway. There is no history of recurrent chest colds or excessive weight gain. She denies orthopnea, chest pain or paroxysmal nocturnal dyspnea or pedal edema. There is no childhood history of asthma. She has family history of thyroid cancer in her mother She did not desaturate on exertion. Spirometry surprisingly showed moderate airway obstruction with FEV1 of 68%, FVC of 98% and a low ratio. The flow volume loop showed flattening of the expiratory limb, the inspiratory limb unfortunately was not recorded.  Past Medical History  Diagnosis Date  . Mood changes     takes zoloft  . Thyroid cancer 2013  . Squamous cell skin cancer, thigh 2012    left  . Seizures     as teenager ; none since  . Hoarseness     began 03/11/2012; associated w/ mild sore throat    Past Surgical History  Procedure Laterality Date  . Throidectomy  03/22/2012  . Thyroidectomy  03/22/2012    Procedure: THYROIDECTOMY;  Surgeon: Serena Colonel, MD;  Location: [facility] OR;  Service: ENT;  Laterality: Bilateral;  Total Thyroidectomy  . Node dissection  03/22/2012    Procedure: NODE DISSECTION;  Surgeon: Serena Colonel, MD;  Location: [facility] OR;  Service: ENT;  Laterality: N/A;  Central Node  Dissection    Allergies  Allergen Reactions  . Sulfa Antibiotics Rash    Severe rash   History   Social History  . Marital Status: Single    Spouse Name: N/A    Number of Children: N/A  . Years of Education: N/A   Occupational History  . bank teller    Social History Main Topics  . Smoking status: Former Smoker -- 5 years    Types: Cigarettes    Quit date: 12/27/2010  . Smokeless tobacco: Never Used     Comment: Socially   . Alcohol Use: 0.6 oz/week    1 Glasses of wine per week     Comment: occasional  . Drug Use: No  . Sexually Active: Yes    Birth Control/ Protection: Implant   Other Topics Concern  . Not on file   Social History Narrative  . No narrative on file    Family History  Problem Relation Age of Onset  . Anesthesia problems Neg Hx   . Thyroid cancer Mother   . Lung cancer Paternal Grandfather         Review of Systems  Constitutional: Positive for unexpected weight change. Negative for fever.  HENT: Positive for trouble swallowing. Negative for ear pain, nosebleeds, congestion, sore throat, rhinorrhea, sneezing, dental problem, postnasal drip and sinus pressure.   Eyes: Negative for redness and itching.  Respiratory: Positive for shortness of breath. Negative for cough, chest tightness and wheezing.  Cardiovascular: Negative for palpitations and leg swelling.  Gastrointestinal: Negative for nausea and vomiting.  Genitourinary: Negative for dysuria.  Musculoskeletal: Negative for joint swelling.  Skin: Negative for rash.  Neurological: Negative for headaches.  Hematological: Does not bruise/bleed easily.  Psychiatric/Behavioral: Negative for dysphoric mood. The patient is not nervous/anxious.        Objective:   Physical Exam        Assessment & Plan:

## 2013-07-06 NOTE — Assessment & Plan Note (Signed)
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 I discussed this with Dr. Pollyann Kennedy. She did have both vocal cords paralyzed after the surgery and I have advised her to see him again to discuss what options may be helpful. She is surprisingly done well with her breathing so far, but unfortunately this likely means that the vocal cords will not spontaneously recover. I have provided her with an albuterol inhaler to use prior to exertion. She will call me back in a week, if this is not helpful I will proceed with post bronchodilator testing to decide if an inhaled steroid/ LABA combination may be of benefit. Rather hold off on this for now pending further  ENT evaluation

## 2013-07-09 ENCOUNTER — Telehealth: Payer: Self-pay | Admitting: *Deleted

## 2013-07-09 NOTE — Telephone Encounter (Signed)
lmtcb x1 

## 2013-07-09 NOTE — Telephone Encounter (Signed)
Message copied by Tommie Sams on Mon Jul 09, 2013  2:48 PM ------      Message from: Cyril Mourning V      Created: Fri Jul 06, 2013  5:20 PM       PL let her know that I spoke to Dr Pollyann Kennedy & she should make an appt to see him soon. ------

## 2013-07-09 NOTE — Telephone Encounter (Signed)
Pt is aware. Nothing further was needed 

## 2013-07-23 ENCOUNTER — Encounter (HOSPITAL_COMMUNITY)
Admission: RE | Admit: 2013-07-23 | Discharge: 2013-07-23 | Disposition: A | Discharge status: Home / Self Care | Payer: 59 | Source: Ambulatory Visit | Attending: Endocrinology | Admitting: Endocrinology

## 2013-07-23 DIAGNOSIS — C73 Malignant neoplasm of thyroid gland: Secondary | ICD-10-CM | POA: Insufficient documentation

## 2013-07-23 MED ORDER — THYROTROPIN ALFA 1.1 MG IM SOLR
0.9000 mg | INTRAMUSCULAR | Status: DC
  Filled 2013-07-23: qty 0.9

## 2013-07-24 ENCOUNTER — Encounter (HOSPITAL_COMMUNITY)
Admission: RE | Admit: 2013-07-24 | Discharge: 2013-07-24 | Disposition: A | Discharge status: Home / Self Care | Payer: 59 | Source: Ambulatory Visit | Attending: Endocrinology | Admitting: Endocrinology

## 2013-07-24 MED ORDER — THYROTROPIN ALFA 1.1 MG IM SOLR
0.9000 mg | INTRAMUSCULAR | Status: DC
  Filled 2013-07-24: qty 0.9

## 2013-07-25 ENCOUNTER — Encounter (HOSPITAL_COMMUNITY)
Admission: RE | Admit: 2013-07-25 | Discharge: 2013-07-25 | Disposition: A | Discharge status: Home / Self Care | Payer: 59 | Source: Ambulatory Visit | Attending: Endocrinology | Admitting: Endocrinology

## 2013-07-27 ENCOUNTER — Encounter (HOSPITAL_COMMUNITY): Payer: Self-pay

## 2013-07-27 ENCOUNTER — Encounter (HOSPITAL_COMMUNITY)
Admission: RE | Admit: 2013-07-27 | Discharge: 2013-07-27 | Disposition: A | Discharge status: Home / Self Care | Payer: 59 | Source: Ambulatory Visit | Attending: Endocrinology | Admitting: Endocrinology

## 2013-07-27 MED ORDER — SODIUM IODIDE I 131 CAPSULE
3.9000 | Freq: Once | INTRAVENOUS | Status: AC | PRN
  Administered 2013-07-27: 3.9 via ORAL

## 2013-08-09 ENCOUNTER — Ambulatory Visit: Payer: 59 | Admitting: Pulmonary Disease

## 2013-10-12 ENCOUNTER — Ambulatory Visit (INDEPENDENT_AMBULATORY_CARE_PROVIDER_SITE_OTHER): Payer: Medicaid Other | Admitting: Nurse Practitioner

## 2013-10-12 VITALS — BP 116/69 | HR 93 | Temp 97.8°F | Ht 66.0 in | Wt 142.0 lb

## 2013-10-12 DIAGNOSIS — J209 Acute bronchitis, unspecified: Secondary | ICD-10-CM

## 2013-10-12 MED ORDER — PREDNISONE 20 MG PO TABS: ORAL_TABLET | ORAL | Status: DC

## 2013-10-12 NOTE — Progress Notes (Signed)
  Subjective:    Patient ID: Emily Smith, female    DOB: July 05, 1986, 27 y.o.   MRN: [id number]  Shortness of Breath Associated symptoms include a sore throat. Pertinent negatives include no ear pain or fever.   Patient in c/o  SOB- started about 7am this morning- when she takes a deep breathe it sounds raspy.    Review of Systems  Constitutional: Negative for fever and fatigue.  HENT: Positive for postnasal drip and sore throat. Negative for ear pain, sinus pressure and trouble swallowing.   Respiratory: Positive for cough and shortness of breath.   Cardiovascular: Negative.        Objective:   Physical Exam  Constitutional: She appears well-developed and well-nourished.  HENT:  Right Ear: Hearing, tympanic membrane, external ear and ear canal normal.  Left Ear: Hearing, tympanic membrane, external ear and ear canal normal.  Nose: Mucosal edema and rhinorrhea present. Right sinus exhibits no maxillary sinus tenderness and no frontal sinus tenderness. Left sinus exhibits no maxillary sinus tenderness and no frontal sinus tenderness.  Mouth/Throat: Uvula is midline, oropharynx is clear and moist and mucous membranes are normal.  Cardiovascular: Normal rate and normal heart sounds.   Pulmonary/Chest: Effort normal and breath sounds normal. No respiratory distress. She has no wheezes. She has no rales. She exhibits no tenderness.  Deep dry cough  Skin: Skin is warm.  Psychiatric: She has a normal mood and affect. Her behavior is normal. Judgment and thought content normal.    BP 116/69  Pulse 93  Temp(Src) 97.8 F (36.6 C) (Oral)  Ht 5\' 6"  (1.676 m)  Wt 142 lb (64.411 kg)  BMI 22.93 kg/m2       Assessment & Plan:  1. Acute bronchitis First 24 Hours-Clear liquids  popsicles  Jello  gatorade  Sprite Second 24 hours-Add Full liquids ( Liquids you cant see through) Third 24 hours- Bland diet ( foods that are baked or broiled)  *avoiding fried foods and highly spiced  foods* During these 3 days  Avoid milk, cheese, ice cream or any other dairy products  Avoid caffeine- REMEMBER Mt. Dew and Mello Yellow contain lots of caffeine You should eat and drink in  Frequent small volumes If no improvement in symptoms or worsen in 2-3 days should RETRUN TO OFFICE or go to ER!    Meds ordered this encounter  Medications  . predniSONE (DELTASONE) 20 MG tablet    Sig: 2 po qd X 5 days    Dispense:  10 tablet    Refill:  0    Order Specific Question:  Supervising Provider    Answer:  Ernestina Penna [[id number]]   Emily Daphine Deutscher, FNP

## 2013-10-12 NOTE — Patient Instructions (Signed)

## 2014-02-18 ENCOUNTER — Encounter: Payer: Self-pay | Admitting: Family Medicine

## 2014-02-18 ENCOUNTER — Ambulatory Visit (INDEPENDENT_AMBULATORY_CARE_PROVIDER_SITE_OTHER): Payer: 59 | Admitting: Family Medicine

## 2014-02-18 ENCOUNTER — Encounter (INDEPENDENT_AMBULATORY_CARE_PROVIDER_SITE_OTHER): Payer: Self-pay

## 2014-02-18 ENCOUNTER — Ambulatory Visit (INDEPENDENT_AMBULATORY_CARE_PROVIDER_SITE_OTHER): Payer: 59

## 2014-02-18 VITALS — BP 98/68 | HR 74 | Temp 98.6°F | Ht 66.5 in | Wt 151.0 lb

## 2014-02-18 DIAGNOSIS — M25519 Pain in unspecified shoulder: Secondary | ICD-10-CM

## 2014-02-18 DIAGNOSIS — M542 Cervicalgia: Secondary | ICD-10-CM

## 2014-02-18 MED ORDER — NAPROXEN 500 MG PO TABS: 500.0000 mg | ORAL_TABLET | Freq: Two times a day (BID) | ORAL | Status: DC

## 2014-02-18 MED ORDER — HYDROCODONE-ACETAMINOPHEN 5-325 MG PO TABS: 1.0000 | ORAL_TABLET | Freq: Four times a day (QID) | ORAL | Status: DC | PRN
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
## 2014-03-04 ENCOUNTER — Ambulatory Visit (INDEPENDENT_AMBULATORY_CARE_PROVIDER_SITE_OTHER): Payer: 59 | Admitting: Family Medicine

## 2014-03-04 VITALS — BP 107/71 | HR 95 | Temp 99.9°F | Wt 150.0 lb

## 2014-03-04 DIAGNOSIS — J029 Acute pharyngitis, unspecified: Secondary | ICD-10-CM

## 2014-03-04 DIAGNOSIS — R51 Headache: Secondary | ICD-10-CM

## 2014-03-04 DIAGNOSIS — R509 Fever, unspecified: Secondary | ICD-10-CM

## 2014-03-04 LAB — POCT RAPID STREP A (OFFICE): Rapid Strep A Screen: NEGATIVE

## 2014-03-04 MED ORDER — CYCLOBENZAPRINE HCL 5 MG PO TABS: 5.0000 mg | ORAL_TABLET | Freq: Three times a day (TID) | ORAL | Status: DC | PRN

## 2014-03-04 MED ORDER — AZITHROMYCIN 250 MG PO TABS: ORAL_TABLET | ORAL | Status: DC

## 2014-03-04 MED ORDER — ACYCLOVIR 800 MG PO TABS: 800.0000 mg | ORAL_TABLET | Freq: Every day | ORAL | Status: DC
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
## 2014-03-28 ENCOUNTER — Encounter: Payer: Self-pay | Admitting: Family Medicine

## 2014-03-28 ENCOUNTER — Ambulatory Visit (INDEPENDENT_AMBULATORY_CARE_PROVIDER_SITE_OTHER): Payer: 59 | Admitting: Family Medicine

## 2014-03-28 ENCOUNTER — Ambulatory Visit (INDEPENDENT_AMBULATORY_CARE_PROVIDER_SITE_OTHER): Payer: 59

## 2014-03-28 VITALS — BP 110/62 | HR 78 | Temp 97.9°F | Ht 66.5 in | Wt 149.2 lb

## 2014-03-28 DIAGNOSIS — M542 Cervicalgia: Secondary | ICD-10-CM

## 2014-03-28 DIAGNOSIS — M549 Dorsalgia, unspecified: Secondary | ICD-10-CM

## 2014-03-28 MED ORDER — NAPROXEN 500 MG PO TABS: 500.0000 mg | ORAL_TABLET | Freq: Two times a day (BID) | ORAL | Status: DC
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
## 2014-04-10 ENCOUNTER — Ambulatory Visit: Payer: 59 | Attending: Family Medicine | Admitting: Physical Therapy

## 2014-04-10 DIAGNOSIS — Z5189 Encounter for other specified aftercare: Secondary | ICD-10-CM | POA: Insufficient documentation

## 2014-04-10 DIAGNOSIS — M542 Cervicalgia: Secondary | ICD-10-CM | POA: Insufficient documentation

## 2014-04-10 DIAGNOSIS — R5381 Other malaise: Secondary | ICD-10-CM | POA: Insufficient documentation

## 2014-04-10 DIAGNOSIS — M549 Dorsalgia, unspecified: Secondary | ICD-10-CM | POA: Insufficient documentation

## 2014-04-12 ENCOUNTER — Ambulatory Visit: Payer: 59 | Admitting: Physical Therapy

## 2014-04-17 ENCOUNTER — Ambulatory Visit: Payer: 59 | Admitting: Physical Therapy

## 2014-04-19 ENCOUNTER — Ambulatory Visit: Payer: 59 | Admitting: Physical Therapy

## 2014-04-23 ENCOUNTER — Ambulatory Visit: Payer: 59 | Admitting: Physical Therapy

## 2014-04-26 ENCOUNTER — Ambulatory Visit: Payer: 59 | Attending: Family Medicine | Admitting: *Deleted

## 2014-04-26 DIAGNOSIS — R5381 Other malaise: Secondary | ICD-10-CM | POA: Insufficient documentation

## 2014-04-26 DIAGNOSIS — M549 Dorsalgia, unspecified: Secondary | ICD-10-CM | POA: Insufficient documentation

## 2014-04-26 DIAGNOSIS — Z5189 Encounter for other specified aftercare: Secondary | ICD-10-CM | POA: Insufficient documentation

## 2014-04-26 DIAGNOSIS — M542 Cervicalgia: Secondary | ICD-10-CM | POA: Insufficient documentation

## 2014-05-01 ENCOUNTER — Ambulatory Visit: Payer: 59 | Admitting: Physical Therapy

## 2014-05-03 ENCOUNTER — Ambulatory Visit: Payer: 59 | Admitting: Physical Therapy

## 2014-05-06 ENCOUNTER — Encounter: Payer: 59 | Admitting: Physical Therapy

## 2014-05-09 ENCOUNTER — Ambulatory Visit: Payer: 59 | Admitting: *Deleted

## 2014-05-10 ENCOUNTER — Encounter: Payer: Self-pay | Admitting: Family Medicine

## 2014-05-10 ENCOUNTER — Ambulatory Visit (INDEPENDENT_AMBULATORY_CARE_PROVIDER_SITE_OTHER): Payer: 59 | Admitting: Family Medicine

## 2014-05-10 VITALS — BP 113/64 | HR 80 | Temp 97.9°F | Ht 66.5 in | Wt 149.2 lb

## 2014-05-10 DIAGNOSIS — R1011 Right upper quadrant pain: Secondary | ICD-10-CM

## 2014-05-10 LAB — POCT CBC
Granulocyte percent: 74.9 %G (ref 37–80)
HCT, POC: 41.5 % (ref 37.7–47.9)
Hemoglobin: 13.3 g/dL (ref 12.2–16.2)
Lymph, poc: 1.8 (ref 0.6–3.4)
MCH, POC: 29.4 pg (ref 27–31.2)
MCHC: 32 g/dL (ref 31.8–35.4)
MCV: 91.7 fL (ref 80–97)
MPV: 8.2 fL (ref 0–99.8)
POC Granulocyte: 6.4 (ref 2–6.9)
POC LYMPH PERCENT: 21.2 %L (ref 10–50)
Platelet Count, POC: 218 10*3/uL (ref 142–424)
RBC: 4.5 M/uL (ref 4.04–5.48)
RDW, POC: 12.6 %
WBC: 8.5 10*3/uL (ref 4.6–10.2)

## 2014-05-10 MED ORDER — ESOMEPRAZOLE MAGNESIUM 40 MG PO CPDR: 40.0000 mg | DELAYED_RELEASE_CAPSULE | Freq: Every day | ORAL | Status: DC

## 2014-05-10 NOTE — Progress Notes (Signed)
   Subjective:    Patient ID: Emily Smith, female    DOB: [birth year], 28 y.o.   MRN: [id number]  HPI This 28 y.o. female presents for evaluation of GERD sx's and abdominal pain. She has been taking nexium and it is not helping.  She states she gets sx's of nausea and epigastric Abdominal discomfort an hour after she eats.   Review of Systems C/o nausea and abdominal pain   No chest pain, SOB, HA, dizziness, vision change, N/V, diarrhea, constipation, dysuria, urinary urgency or frequency, myalgias, arthralgias or rash.  Objective:   Physical Exam Vital signs noted  Well developed well nourished female.  HEENT - Head atraumatic Normocephalic                Eyes - PERRLA, Conjuctiva - clear Sclera- Clear EOMI                Ears - EAC's Wnl TM's Wnl Gross Hearing WNL                Throat - oropharanx wnl Respiratory - Lungs CTA bilateral Cardiac - RRR S1 and S2 without murmur GI - Abdomen soft tender epigastric area, negative murphy's, and bowel sounds active x 4 Extremities - No edema.      Assessment & Plan:  Abdominal pain, right upper quadrant - Plan: esomeprazole (NEXIUM) 40 MG capsule, POCT CBC, Lipase, CMP14+EGFR, US Abdomen Limited RUQ  Lysbeth Penner FNP

## 2014-05-11 LAB — LIPASE: Lipase: 40 U/L (ref 0–59)

## 2014-05-11 LAB — CMP14+EGFR
ALT: 13 IU/L (ref 0–32)
AST: 17 IU/L (ref 0–40)
Albumin/Globulin Ratio: 2.1 (ref 1.1–2.5)
Albumin: 4.4 g/dL (ref 3.5–5.5)
Alkaline Phosphatase: 42 IU/L (ref 39–117)
BUN/Creatinine Ratio: 15 (ref 8–20)
BUN: 13 mg/dL (ref 6–20)
CO2: 25 mmol/L (ref 18–29)
Calcium: 8.9 mg/dL (ref 8.7–10.2)
Chloride: 104 mmol/L (ref 97–108)
Creatinine, Ser: 0.86 mg/dL (ref 0.57–1.00)
GFR calc Af Amer: 108 mL/min/{1.73_m2} (ref >59)
GFR calc non Af Amer: 94 mL/min/{1.73_m2} (ref >59)
Globulin, Total: 2.1 g/dL (ref 1.5–4.5)
Glucose: 90 mg/dL (ref 65–99)
Potassium: 4.1 mmol/L (ref 3.5–5.2)
Sodium: 143 mmol/L (ref 134–144)
Total Bilirubin: 0.9 mg/dL (ref 0.0–1.2)
Total Protein: 6.5 g/dL (ref 6.0–8.5)

## 2014-05-14 ENCOUNTER — Encounter: Payer: 59 | Admitting: Physical Therapy

## 2014-05-16 ENCOUNTER — Encounter: Payer: 59 | Admitting: Physical Therapy

## 2014-05-16 LAB — SPECIMEN STATUS REPORT

## 2014-05-16 LAB — THYROID PANEL WITH TSH
Free Thyroxine Index: 3.9 (ref 1.2–4.9)
T3 Uptake Ratio: 35 % (ref 24–39)
T4, Total: 11 ug/dL (ref 4.5–12.0)
TSH: 0.272 u[IU]/mL — ABNORMAL LOW (ref 0.450–4.500)

## 2014-05-21 ENCOUNTER — Other Ambulatory Visit: Payer: Self-pay | Admitting: Family Medicine

## 2014-05-24 ENCOUNTER — Ambulatory Visit: Payer: 59 | Admitting: *Deleted

## 2014-05-30 ENCOUNTER — Ambulatory Visit: Payer: 59 | Admitting: Physical Therapy

## 2014-06-11 ENCOUNTER — Telehealth: Payer: Self-pay | Admitting: *Deleted

## 2014-06-11 MED ORDER — AZITHROMYCIN 250 MG PO TABS: ORAL_TABLET | ORAL | Status: DC

## 2014-06-11 NOTE — Telephone Encounter (Signed)
Pt reports cough, congestion, sore throat and wants zpak called in to pharm- this is approved by [employer]. DONE AND PT AWARE

## 2014-07-17 ENCOUNTER — Other Ambulatory Visit (INDEPENDENT_AMBULATORY_CARE_PROVIDER_SITE_OTHER): Payer: 59

## 2014-07-17 ENCOUNTER — Other Ambulatory Visit: Payer: Self-pay | Admitting: Family Medicine

## 2014-07-17 DIAGNOSIS — D3190 Benign neoplasm of unspecified part of unspecified eye: Secondary | ICD-10-CM

## 2014-07-17 DIAGNOSIS — E039 Hypothyroidism, unspecified: Secondary | ICD-10-CM

## 2014-07-18 LAB — SPECIMEN STATUS REPORT

## 2014-07-18 LAB — TSH: TSH: 61.2 u[IU]/mL — AB (ref 0.450–4.500)

## 2014-07-19 LAB — THYROGLOBULIN, SERUM: THYROGLOBULIN, SERUM: 0.4 ng/mL — ABNORMAL LOW (ref 1.5–38.5)

## 2014-07-19 LAB — SPECIMEN STATUS REPORT

## 2014-07-19 LAB — THYROGLOBULIN WITH ANTI-TG AB: Antithyroglobulin Ab: <1 IU/mL (ref 0.0–0.9)

## 2014-07-25 ENCOUNTER — Other Ambulatory Visit (HOSPITAL_COMMUNITY): Payer: Self-pay | Admitting: Endocrinology

## 2014-07-25 DIAGNOSIS — C73 Malignant neoplasm of thyroid gland: Secondary | ICD-10-CM

## 2014-08-05 ENCOUNTER — Encounter (HOSPITAL_COMMUNITY)
Admission: RE | Admit: 2014-08-05 | Discharge: 2014-08-05 | Disposition: A | Discharge status: Home / Self Care | Payer: 59 | Source: Ambulatory Visit | Attending: Endocrinology | Admitting: Endocrinology

## 2014-08-05 DIAGNOSIS — C73 Malignant neoplasm of thyroid gland: Secondary | ICD-10-CM | POA: Insufficient documentation

## 2014-08-05 MED ORDER — THYROTROPIN ALFA 1.1 MG IM SOLR
0.9000 mg | INTRAMUSCULAR | Status: AC
  Administered 2014-08-05: 0.9 mg via INTRAMUSCULAR

## 2014-08-06 ENCOUNTER — Encounter (HOSPITAL_COMMUNITY)
Admission: RE | Admit: 2014-08-06 | Discharge: 2014-08-06 | Disposition: A | Discharge status: Home / Self Care | Payer: 59 | Source: Ambulatory Visit | Attending: Endocrinology | Admitting: Endocrinology

## 2014-08-06 DIAGNOSIS — C73 Malignant neoplasm of thyroid gland: Secondary | ICD-10-CM | POA: Diagnosis not present

## 2014-08-06 MED ORDER — THYROTROPIN ALFA 1.1 MG IM SOLR
0.9000 mg | INTRAMUSCULAR | Status: AC
  Administered 2014-08-06: 0.9 mg via INTRAMUSCULAR

## 2014-08-07 ENCOUNTER — Encounter (HOSPITAL_COMMUNITY)
Admission: RE | Admit: 2014-08-07 | Discharge: 2014-08-07 | Disposition: A | Discharge status: Home / Self Care | Payer: 59 | Source: Ambulatory Visit | Attending: Endocrinology | Admitting: Endocrinology

## 2014-08-07 DIAGNOSIS — C73 Malignant neoplasm of thyroid gland: Secondary | ICD-10-CM | POA: Diagnosis not present

## 2014-08-07 LAB — HCG, SERUM, QUALITATIVE: Preg, Serum: NEGATIVE

## 2014-08-07 MED ORDER — SODIUM IODIDE I 131 CAPSULE
4.0000 | Freq: Once | INTRAVENOUS | Status: AC | PRN
  Administered 2014-08-07: 4 via ORAL

## 2014-08-09 ENCOUNTER — Encounter (HOSPITAL_COMMUNITY)
Admission: RE | Admit: 2014-08-09 | Discharge: 2014-08-09 | Disposition: A | Discharge status: Home / Self Care | Payer: 59 | Source: Ambulatory Visit | Attending: Endocrinology | Admitting: Endocrinology

## 2014-08-09 DIAGNOSIS — C73 Malignant neoplasm of thyroid gland: Secondary | ICD-10-CM | POA: Diagnosis not present

## 2014-08-09 MED ORDER — SODIUM IODIDE I 131 CAPSULE: 4.0000 | Freq: Once | INTRAVENOUS | Status: AC | PRN

## 2014-10-08 ENCOUNTER — Other Ambulatory Visit: Payer: Self-pay | Admitting: Nurse Practitioner

## 2014-10-08 MED ORDER — FLUCONAZOLE 150 MG PO TABS: 150.0000 mg | ORAL_TABLET | Freq: Once | ORAL | Status: DC

## 2014-10-11 ENCOUNTER — Encounter: Payer: Self-pay | Admitting: Family Medicine

## 2014-10-11 ENCOUNTER — Encounter (INDEPENDENT_AMBULATORY_CARE_PROVIDER_SITE_OTHER): Payer: Self-pay

## 2014-10-11 ENCOUNTER — Ambulatory Visit (INDEPENDENT_AMBULATORY_CARE_PROVIDER_SITE_OTHER): Payer: 59 | Admitting: Family Medicine

## 2014-10-11 VITALS — BP 106/67 | HR 80 | Temp 98.7°F | Wt 153.6 lb

## 2014-10-11 DIAGNOSIS — F909 Attention-deficit hyperactivity disorder, unspecified type: Secondary | ICD-10-CM

## 2014-10-11 DIAGNOSIS — F988 Other specified behavioral and emotional disorders with onset usually occurring in childhood and adolescence: Secondary | ICD-10-CM

## 2014-10-11 DIAGNOSIS — M542 Cervicalgia: Secondary | ICD-10-CM

## 2014-10-11 DIAGNOSIS — Z30011 Encounter for initial prescription of contraceptive pills: Secondary | ICD-10-CM

## 2014-10-11 MED ORDER — NORGESTIM-ETH ESTRAD TRIPHASIC 0.18/0.215/0.25 MG-35 MCG PO TABS: 1.0000 | ORAL_TABLET | Freq: Every day | ORAL | Status: DC

## 2014-10-11 MED ORDER — LISDEXAMFETAMINE DIMESYLATE 50 MG PO CAPS: 50.0000 mg | ORAL_CAPSULE | Freq: Every day | ORAL | Status: DC

## 2014-10-11 MED ORDER — NAPROXEN 500 MG PO TABS: 500.0000 mg | ORAL_TABLET | Freq: Two times a day (BID) | ORAL | Status: DC

## 2014-10-11 NOTE — Progress Notes (Signed)
   Subjective:    Patient ID: Emily Smith, female    DOB: [birth year], 28 y.o.   MRN: [id number]  HPI This 28 y.o. female presents for evaluation of having ADD sx's and she is having left neck discomfort. She is wanting to get on OCP's.   Review of Systems    No chest pain, SOB, HA, dizziness, vision change, N/V, diarrhea, constipation, dysuria, urinary urgency or frequency, myalgias, arthralgias or rash.  Objective:   Physical Exam Vital signs noted  Well developed well nourished female.  HEENT - Head atraumatic Normocephalic                Eyes - PERRLA, Conjuctiva - clear Sclera- Clear EOMI                Ears - EAC's Wnl TM's Wnl Gross Hearing WNL                Nose - Nares patent                 Throat - oropharanx wnl Respiratory - Lungs CTA bilateral Cardiac - RRR S1 and S2 without murmur GI - Abdomen soft Nontender and bowel sounds active x 4 Extremities - No edema. Neuro - Grossly intact. MS - TTP left cervical paraspinous muscles      Assessment & Plan:  ADD (attention deficit disorder) - Plan: lisdexamfetamine (VYVANSE) 50 MG capsule  Encounter for initial prescription of contraceptive pills - Plan: Norgestimate-Ethinyl Estradiol Triphasic (ORTHO TRI-CYCLEN, 28,) 0.18/0.215/0.25 MG-35 MCG tablet  Cervicalgia - Plan: naproxen (NAPROSYN) 500 MG tablet po bid x 10 days  Lysbeth Penner FNP

## 2014-10-17 ENCOUNTER — Telehealth: Payer: Self-pay | Admitting: *Deleted

## 2014-10-17 NOTE — Telephone Encounter (Signed)
Bill, Ins will not cover vyvanse, until javon has tried and failed these generic meds: 1.generic amphetamine/dextroamphetamine extended-release, 2. Generic dexmethyphenidate extended release,3.generic  Dextroamphetamine extended-release, 4. Generic methylphenidate extended release 5 metadate er(has to have PA)  6. Generic methylphenidate sustained release. Will one of these  Work for her. Thanks

## 2014-10-21 ENCOUNTER — Other Ambulatory Visit: Payer: Self-pay | Admitting: Family Medicine

## 2014-10-21 MED ORDER — AMPHETAMINE-DEXTROAMPHETAMINE 20 MG PO TABS: 20.0000 mg | ORAL_TABLET | Freq: Two times a day (BID) | ORAL | Status: DC

## 2014-10-24 ENCOUNTER — Other Ambulatory Visit: Payer: Self-pay | Admitting: *Deleted

## 2014-10-24 DIAGNOSIS — F909 Attention-deficit hyperactivity disorder, unspecified type: Secondary | ICD-10-CM

## 2014-10-24 MED ORDER — AMPHETAMINE-DEXTROAMPHETAMINE 20 MG PO TABS: 20.0000 mg | ORAL_TABLET | Freq: Two times a day (BID) | ORAL | Status: DC

## 2014-12-23 ENCOUNTER — Other Ambulatory Visit: Payer: Self-pay | Admitting: Family Medicine

## 2014-12-23 ENCOUNTER — Encounter: Payer: Self-pay | Admitting: Family Medicine

## 2014-12-23 ENCOUNTER — Ambulatory Visit (INDEPENDENT_AMBULATORY_CARE_PROVIDER_SITE_OTHER): Payer: 59 | Admitting: Family Medicine

## 2014-12-23 VITALS — BP 118/74 | HR 82 | Temp 98.7°F | Ht 66.5 in | Wt 145.0 lb

## 2014-12-23 DIAGNOSIS — J069 Acute upper respiratory infection, unspecified: Secondary | ICD-10-CM

## 2014-12-23 DIAGNOSIS — F909 Attention-deficit hyperactivity disorder, unspecified type: Secondary | ICD-10-CM

## 2014-12-23 MED ORDER — LEVALBUTEROL HCL 1.25 MG/0.5ML IN NEBU: 1.2500 mg | INHALATION_SOLUTION | Freq: Once | RESPIRATORY_TRACT | Status: DC

## 2014-12-23 MED ORDER — AMPHETAMINE-DEXTROAMPHETAMINE 20 MG PO TABS: 20.0000 mg | ORAL_TABLET | Freq: Two times a day (BID) | ORAL | Status: DC

## 2014-12-23 MED ORDER — AZITHROMYCIN 250 MG PO TABS: ORAL_TABLET | ORAL | Status: DC

## 2014-12-23 MED ORDER — LEVALBUTEROL HCL 1.25 MG/3ML IN NEBU
1.2500 mg | INHALATION_SOLUTION | Freq: Once | RESPIRATORY_TRACT | Status: AC
  Administered 2014-12-23: 1.25 mg via RESPIRATORY_TRACT

## 2014-12-23 NOTE — Progress Notes (Signed)
   Subjective:    Patient ID: Emily Smith, female    DOB: 27-Apr-1986, 27 y.o.   MRN: [id number]  HPI Patient c/o uri sx's and cough for a week.  She is feeling tight with breathing and gets SOB.  Review of Systems  Constitutional: Negative for fever.  HENT: Negative for ear pain.   Eyes: Negative for discharge.  Respiratory: Negative for cough.   Cardiovascular: Negative for chest pain.  Gastrointestinal: Negative for abdominal distention.  Endocrine: Negative for polyuria.  Genitourinary: Negative for difficulty urinating.  Musculoskeletal: Negative for gait problem and neck pain.  Skin: Negative for color change and rash.  Neurological: Negative for speech difficulty and headaches.  Psychiatric/Behavioral: Negative for agitation.       Objective:    BP 118/74 mmHg  Pulse 82  Temp(Src) 98.7 F (37.1 C) (Oral)  Ht 5' 6.5" (1.689 m)  Wt 145 lb (65.772 kg)  BMI 23.06 kg/m2  LMP 12/19/2014 Physical Exam  Constitutional: She is oriented to person, place, and time. She appears well-developed and well-nourished.  HENT:  Head: Normocephalic and atraumatic.  Mouth/Throat: Oropharynx is clear and moist.  Eyes: Pupils are equal, round, and reactive to light.  Neck: Normal range of motion. Neck supple.  Cardiovascular: Normal rate and regular rhythm.   No murmur heard. Pulmonary/Chest: Effort normal. She has wheezes.  Abdominal: Soft. Bowel sounds are normal. There is no tenderness.  Neurological: She is alert and oriented to person, place, and time.  Skin: Skin is warm and dry.  Psychiatric: She has a normal mood and affect.          Assessment & Plan:     ICD-9-CM ICD-10-CM   1. URI (upper respiratory infection) 465.9 J06.9 azithromycin (ZITHROMAX) 250 MG tablet     levalbuterol (XOPENEX) nebulizer solution 1.25 mg    Push po fluids, rest, tylenol and motrin otc prn as directed for fever, arthralgias, and myalgias.  Follow up prn if sx's continue or  persist. Return if symptoms worsen or fail to improve.  Lysbeth Penner FNP

## 2014-12-23 NOTE — Addendum Note (Signed)
Addended by: Marin Olp on: 12/23/2014 11:45 AM   Modules accepted: Orders, SmartSet

## 2015-01-29 ENCOUNTER — Other Ambulatory Visit: Payer: Self-pay | Admitting: *Deleted

## 2015-01-29 MED ORDER — HYDROCORTISONE ACETATE 25 MG RE SUPP: 25.0000 mg | Freq: Two times a day (BID) | RECTAL | Status: DC

## 2015-01-29 NOTE — Telephone Encounter (Signed)
Pt called with complaints of external hemorrhoids Wants prescription

## 2015-01-31 NOTE — Telephone Encounter (Signed)
Pt.notified

## 2015-03-05 ENCOUNTER — Encounter: Payer: Self-pay | Admitting: Family Medicine

## 2015-03-05 ENCOUNTER — Ambulatory Visit (INDEPENDENT_AMBULATORY_CARE_PROVIDER_SITE_OTHER): Payer: 59 | Admitting: Family Medicine

## 2015-03-05 DIAGNOSIS — F909 Attention-deficit hyperactivity disorder, unspecified type: Secondary | ICD-10-CM | POA: Insufficient documentation

## 2015-03-05 MED ORDER — AMPHETAMINE-DEXTROAMPHETAMINE 20 MG PO TABS: 20.0000 mg | ORAL_TABLET | Freq: Two times a day (BID) | ORAL | Status: DC

## 2015-03-05 NOTE — Progress Notes (Signed)
   Subjective:    Patient ID: Emily Smith, female    DOB: 1986/09/23, 29 y.o.   MRN: [id number]  HPI  29 year old female here to follow-up ADHD. She was recently started on Adderall. If she takes pills twice a day she does have some difficulty with insomnia but generally she is more productive at work and able to complete her task on a more timely basis. She has not lost any weight by history but she does note decreased appetite.    Review of Systems  Constitutional: Negative.   HENT: Negative.   Eyes: Negative.   Respiratory: Negative.   Cardiovascular: Negative.   Gastrointestinal: Negative.   Endocrine: Negative.   Genitourinary: Negative.   Hematological: Negative.   Psychiatric/Behavioral: Negative.    Patient Active Problem List   Diagnosis Date Noted  . Dyspnea 07/06/2013   Outpatient Encounter Prescriptions as of 03/05/2015  Medication Sig  . amphetamine-dextroamphetamine (ADDERALL) 20 MG tablet Take 1 tablet (20 mg total) by mouth 2 (two) times daily.  Marland Kitchen levothyroxine (SYNTHROID, LEVOTHROID) 150 MCG tablet Take 150 mcg by mouth daily before breakfast.  . Norgestimate-Ethinyl Estradiol Triphasic (ORTHO TRI-CYCLEN, 28,) 0.18/0.215/0.25 MG-35 MCG tablet Take 1 tablet by mouth daily.  Marland Kitchen esomeprazole (NEXIUM) 40 MG capsule Take 1 capsule (40 mg total) by mouth daily.  . hydrocortisone (ANUSOL-HC) 25 MG suppository Place 1 suppository (25 mg total) rectally 2 (two) times daily.  . [DISCONTINUED] azithromycin (ZITHROMAX) 250 MG tablet Take 2 po first day and then one po qd x 4 days      Objective:   Physical Exam  Constitutional: She is oriented to person, place, and time. She appears well-developed and well-nourished.  Eyes: Conjunctivae and EOM are normal.  Neck: Normal range of motion. Neck supple.  Cardiovascular: Normal rate, regular rhythm and normal heart sounds.   Pulmonary/Chest: Effort normal and breath sounds normal.  Abdominal: Soft. Bowel sounds are normal.    Musculoskeletal: Normal range of motion.  Neurological: She is alert and oriented to person, place, and time. She has normal reflexes.  Skin: Skin is warm and dry.  Psychiatric: She has a normal mood and affect. Her behavior is normal. Thought content normal.          Assessment & Plan:  1. Attention deficit hyperactivity disorder (ADHD), unspecified ADHD type  refill Adderall as before x 6 months  Wardell Honour MD

## 2015-03-18 ENCOUNTER — Ambulatory Visit (INDEPENDENT_AMBULATORY_CARE_PROVIDER_SITE_OTHER): Payer: 59 | Admitting: Nurse Practitioner

## 2015-03-18 ENCOUNTER — Encounter: Payer: Self-pay | Admitting: Nurse Practitioner

## 2015-03-18 VITALS — BP 110/70 | HR 94 | Temp 97.0°F | Ht 66.0 in | Wt 132.0 lb

## 2015-03-18 DIAGNOSIS — M940 Chondrocostal junction syndrome [Tietze]: Secondary | ICD-10-CM | POA: Diagnosis not present

## 2015-03-18 MED ORDER — PREDNISONE 20 MG PO TABS: ORAL_TABLET | ORAL | Status: DC

## 2015-03-18 NOTE — Progress Notes (Signed)
  Subjective:    Emily Smith is a 29 y.o. female who presents for evaluation of chest pain. Onset was 1 day ago. Symptoms have improved since that time. The patient describes the pain as sharp and does not radiate. Patient rates pain as a 2/10 in intensity. States this was 8/10 last night which awoke her. Associated symptoms are: chest pain and dyspnea. Aggravating factors are: none. Alleviating factors are: none. Patient's cardiac risk factors are: none. Patient's risk factors for DVT/PE: none. Previous cardiac testing: chest x-ray and thyroid function. The patient states her son was sick with a stomach virus 2 days ago and she had a headache and nausea all day yesterday prior to this pain starting last night.   The following portions of the patient's history were reviewed and updated as appropriate: allergies, current medications, past family history, past medical history, past social history, past surgical history and problem list.  Review of Systems Constitutional: negative Respiratory: positive for pleurisy/chest pain Cardiovascular: positive for chest pain, chest pressure/discomfort and dyspnea Gastrointestinal: positive for nausea    Objective:    General appearance: alert, cooperative, appears stated age and no distress Neck: no adenopathy, no carotid bruit, no JVD, supple, symmetrical, trachea midline and thyroid not enlarged, symmetric, no tenderness/mass/nodules Lungs: clear to auscultation bilaterally Heart: regular rate and rhythm, S1, S2 normal, no murmur, click, rub or gallop Abdomen: soft, non-tender; bowel sounds normal; no masses,  no organomegaly  Chest: sharp pain at end of deep inspiration to left of sternum.   Cardiographics ECG: no prior ECG  Imaging Chest x-ray: not indicated    Assessment:    Chest pain, suspected etiology: chest wall pain    Plan:     1. Costochondritis    Meds ordered this encounter  Medications  . predniSONE (DELTASONE) 20 MG tablet     Sig: 2 po at sametime daily for 5 days    Dispense:  10 tablet    Refill:  0    Order Specific Question:  Supervising Provider    Answer:  Chipper Herb [[id number]]   Force fluids RTO prn  Mary-Margaret Hassell Done, FNP

## 2015-03-18 NOTE — Patient Instructions (Signed)
Costochondritis Costochondritis, sometimes called Tietze syndrome, is a swelling and irritation (inflammation) of the tissue (cartilage) that connects your ribs with your breastbone (sternum). It causes pain in the chest and rib area. Costochondritis usually goes away on its own over time. It can take up to 6 weeks or longer to get better, especially if you are unable to limit your activities. CAUSES  Some cases of costochondritis have no known cause. Possible causes include:  Injury (trauma).  Exercise or activity such as lifting.  Severe coughing. SIGNS AND SYMPTOMS  Pain and tenderness in the chest and rib area.  Pain that gets worse when coughing or taking deep breaths.  Pain that gets worse with specific movements. DIAGNOSIS  Your health care provider will do a physical exam and ask about your symptoms. Chest X-rays or other tests may be done to rule out other problems. TREATMENT  Costochondritis usually goes away on its own over time. Your health care provider may prescribe medicine to help relieve pain. HOME CARE INSTRUCTIONS   Avoid exhausting physical activity. Try not to strain your ribs during normal activity. This would include any activities using chest, abdominal, and side muscles, especially if heavy weights are used.  Apply ice to the affected area for the first 2 days after the pain begins.  Put ice in a plastic bag.  Place a towel between your skin and the bag.  Leave the ice on for 20 minutes, 2-3 times a day.  Only take over-the-counter or prescription medicines as directed by your health care provider. SEEK MEDICAL CARE IF:  You have redness or swelling at the rib joints. These are signs of infection.  Your pain does not go away despite rest or medicine. SEEK IMMEDIATE MEDICAL CARE IF:   Your pain increases or you are very uncomfortable.  You have shortness of breath or difficulty breathing.  You cough up blood.  You have worse chest pains,  sweating, or vomiting.  You have a fever or persistent symptoms for more than 2-3 days.  You have a fever and your symptoms suddenly get worse. MAKE SURE YOU:   Understand these instructions.  Will watch your condition.  Will get help right away if you are not doing well or get worse. Document Released: 09/22/2005 Document Revised: 10/03/2013 Document Reviewed: 07/17/2013 Exit[employer] Patient Information 2015 [employer]. This information is not intended to replace advice given to you by your health care provider. Make sure you discuss any questions you have with your health care provider.  

## 2015-04-15 ENCOUNTER — Ambulatory Visit (INDEPENDENT_AMBULATORY_CARE_PROVIDER_SITE_OTHER): Payer: 59 | Admitting: Family Medicine

## 2015-04-15 ENCOUNTER — Encounter: Payer: Self-pay | Admitting: Family Medicine

## 2015-04-15 VITALS — BP 109/72 | HR 87 | Temp 97.9°F | Ht 66.0 in | Wt 132.0 lb

## 2015-04-15 DIAGNOSIS — J301 Allergic rhinitis due to pollen: Secondary | ICD-10-CM

## 2015-04-15 DIAGNOSIS — L259 Unspecified contact dermatitis, unspecified cause: Secondary | ICD-10-CM | POA: Diagnosis not present

## 2015-04-15 DIAGNOSIS — R05 Cough: Secondary | ICD-10-CM | POA: Diagnosis not present

## 2015-04-15 DIAGNOSIS — R059 Cough, unspecified: Secondary | ICD-10-CM

## 2015-04-15 MED ORDER — METHYLPREDNISOLONE ACETATE 80 MG/ML IJ SUSP
60.0000 mg | Freq: Once | INTRAMUSCULAR | Status: AC
  Administered 2015-04-15: 60 mg via INTRAMUSCULAR

## 2015-04-15 MED ORDER — FLUTICASONE PROPIONATE 50 MCG/ACT NA SUSP: 2.0000 | Freq: Every day | NASAL | Status: DC

## 2015-04-15 MED ORDER — PREDNISONE 10 MG PO TABS: ORAL_TABLET | ORAL | Status: DC

## 2015-04-15 NOTE — Patient Instructions (Signed)
Take prednisone as directed Take Benadryl as needed for itching and pruritus Use Flonase for allergic rhinitis Use nasal saline during the day and take Mucinex maximum strength, blue and white in color, 1 twice daily as needed for cough and congestion

## 2015-04-15 NOTE — Progress Notes (Signed)
Subjective:    Patient ID: Emily Smith, female    DOB: 1986-06-22, 29 y.o.   MRN: [id number]  HPI Patient here today for poison oak and some cough with congestion.         Patient Active Problem List   Diagnosis Date Noted  . ADHD (attention deficit hyperactivity disorder) 03/05/2015  . Dyspnea 07/06/2013   Outpatient Encounter Prescriptions as of 04/15/2015  Medication Sig  . amphetamine-dextroamphetamine (ADDERALL) 20 MG tablet Take 1 tablet (20 mg total) by mouth 2 (two) times daily.  Marland Kitchen levothyroxine (SYNTHROID, LEVOTHROID) 200 MCG tablet Take 200 mcg by mouth daily before breakfast.  . [DISCONTINUED] levothyroxine (SYNTHROID, LEVOTHROID) 150 MCG tablet Take 150 mcg by mouth daily before breakfast.  . [DISCONTINUED] predniSONE (DELTASONE) 20 MG tablet 2 po at sametime daily for 5 days  . [DISCONTINUED] levalbuterol (XOPENEX) nebulizer solution 1.25 mg     Review of Systems  Constitutional: Negative.   HENT: Positive for congestion and postnasal drip.   Eyes: Negative.   Respiratory: Positive for cough.   Cardiovascular: Negative.   Gastrointestinal: Negative.   Endocrine: Negative.   Genitourinary: Negative.   Musculoskeletal: Negative.   Skin: Positive for rash (mostly on face and arms).  Allergic/Immunologic: Negative.   Neurological: Negative.   Hematological: Negative.   Psychiatric/Behavioral: Negative.        Objective:   Physical Exam  Constitutional: She is oriented to person, place, and time. She appears well-developed and well-nourished. No distress.  HENT:  Head: Normocephalic and atraumatic.  Right Ear: External ear normal.  Left Ear: External ear normal.  Mouth/Throat: Oropharynx is clear and moist. No oropharyngeal exudate.  Nasal congestion and turbinate swelling  Eyes: Conjunctivae and EOM are normal. Pupils are equal, round, and reactive to light. Right eye exhibits no discharge. Left eye exhibits no discharge. No scleral icterus.  Neck:  Normal range of motion. Neck supple. No thyromegaly present.  Cardiovascular: Normal rate, regular rhythm and normal heart sounds.   No murmur heard. Pulmonary/Chest: Effort normal and breath sounds normal. No respiratory distress. She has no wheezes. She has no rales. She exhibits no tenderness.  Dry cough  Abdominal: She exhibits no mass.  Musculoskeletal: Normal range of motion.  Lymphadenopathy:    She has no cervical adenopathy.  Neurological: She is alert and oriented to person, place, and time.  Skin: Skin is warm and dry. Rash noted. There is erythema.  The patient has areas of contact dermatitis on the majority of the face and on  arms and her side.  Psychiatric: She has a normal mood and affect. Her behavior is normal. Judgment and thought content normal.  Nursing note and vitals reviewed.  BP 109/72 mmHg  Pulse 87  Temp(Src) 97.9 F (36.6 C) (Oral)  Ht 5\' 6"  (1.676 m)  Wt 132 lb (59.875 kg)  BMI 21.32 kg/m2        Assessment & Plan:  1. Allergic rhinitis due to pollen -Also use nasal saline frequently during the day - fluticasone (FLONASE) 50 MCG/ACT nasal spray; Place 2 sprays into both nostrils daily.  Dispense: 16 g; Refill: 6  2. Cough -Take Mucinex as needed for cough and congestion -Drink plenty of fluids  3. Contact dermatitis -Take prednisone as directed - predniSONE (DELTASONE) 10 MG tablet; 1 tablet 4 times a day for 2 days,  1 tablet 3 times a day for 2 days,  1 tablet 2 times a day for 2 days, 1 tablet daily for 2  days  Dispense: 20 tablet; Refill: 0 - methylPREDNISolone acetate (DEPO-MEDROL) injection 60 mg; Inject 0.75 mLs (60 mg total) into the muscle once.  Patient Instructions  Take prednisone as directed Take Benadryl as needed for itching and pruritus Use Flonase for allergic rhinitis Use nasal saline during the day and take Mucinex maximum strength, blue and white in color, 1 twice daily as needed for cough and congestion   Arrie Senate MD

## 2015-04-22 ENCOUNTER — Encounter (INDEPENDENT_AMBULATORY_CARE_PROVIDER_SITE_OTHER): Payer: 59 | Admitting: Family Medicine

## 2015-04-22 DIAGNOSIS — L259 Unspecified contact dermatitis, unspecified cause: Secondary | ICD-10-CM | POA: Diagnosis not present

## 2015-04-22 MED ORDER — METHYLPREDNISOLONE ACETATE 80 MG/ML IJ SUSP
40.0000 mg | Freq: Once | INTRAMUSCULAR | Status: AC
  Administered 2015-04-22: 40 mg via INTRAMUSCULAR

## 2015-04-22 MED ORDER — PREDNISONE 10 MG PO TABS: ORAL_TABLET | ORAL | Status: DC

## 2015-04-22 NOTE — Progress Notes (Signed)
This encounter was created in error - please disregard.

## 2015-04-29 ENCOUNTER — Other Ambulatory Visit: Payer: Self-pay | Admitting: Physician Assistant

## 2015-04-29 DIAGNOSIS — L247 Irritant contact dermatitis due to plants, except food: Secondary | ICD-10-CM

## 2015-04-29 MED ORDER — CETIRIZINE HCL 10 MG PO TABS: ORAL_TABLET | ORAL | Status: DC

## 2015-04-29 MED ORDER — TRIAMCINOLONE ACETONIDE 0.1 % EX CREA: 1.0000 "application " | TOPICAL_CREAM | Freq: Two times a day (BID) | CUTANEOUS | Status: DC

## 2015-05-05 ENCOUNTER — Other Ambulatory Visit: Payer: Self-pay | Admitting: *Deleted

## 2015-05-05 MED ORDER — FLUCONAZOLE 150 MG PO TABS
150.0000 mg | ORAL_TABLET | Freq: Once | ORAL | Status: DC
Start: 2015-05-05 — End: 2015-10-13

## 2015-06-11 ENCOUNTER — Other Ambulatory Visit: Payer: Self-pay | Admitting: *Deleted

## 2015-06-11 DIAGNOSIS — E039 Hypothyroidism, unspecified: Secondary | ICD-10-CM

## 2015-06-11 MED ORDER — LEVOTHYROXINE SODIUM 200 MCG PO TABS: 200.0000 ug | ORAL_TABLET | Freq: Every day | ORAL | Status: AC

## 2015-07-17 ENCOUNTER — Ambulatory Visit (INDEPENDENT_AMBULATORY_CARE_PROVIDER_SITE_OTHER): Payer: 59

## 2015-07-17 ENCOUNTER — Encounter: Payer: Self-pay | Admitting: Physician Assistant

## 2015-07-17 ENCOUNTER — Ambulatory Visit (INDEPENDENT_AMBULATORY_CARE_PROVIDER_SITE_OTHER): Payer: 59 | Admitting: Physician Assistant

## 2015-07-17 VITALS — BP 102/67 | HR 82 | Temp 97.8°F | Wt 140.4 lb

## 2015-07-17 DIAGNOSIS — M79672 Pain in left foot: Secondary | ICD-10-CM | POA: Diagnosis not present

## 2015-07-17 NOTE — Progress Notes (Signed)
   Subjective:    Patient ID: [name], female    DOB: Nov 28, 1986, 28 y.o.   MRN: [id number]  HPI29 y/o female presents for left foot pain x 1 day s/p [place] the [place]. She had immediate pain. She has tried ibuprofen with no relief. Pain is worse with leg hanging down and walking. Better with rest. Radiating pain up her leg     Review of Systems  Musculoskeletal: Positive for myalgias and joint swelling. Arthralgias: left foot.       Objective:   Physical Exam  Constitutional: She is oriented to person, place, and time. She appears well-developed and well-nourished. No distress.  Musculoskeletal: Normal range of motion. She exhibits edema and tenderness (slightly anterior to lateral malleolus).  Neurological: She is alert and oriented to person, place, and time.  Skin: She is not diaphoretic.  Psychiatric: She has a normal mood and affect. Her behavior is normal. Judgment and thought content normal.  Nursing note and vitals reviewed.         Assessment & Plan:  1. Left foot pain - Most likely ankle sprain but questionable xray. Will report results to patient when return from radiologists and refer to ortho if needed. Otherwise, will treat as sprain at this time. Advised patient to take 2 Aleve twice daily. Ankle brace applied in office. Rest, ice, elevation.  - DG Foot Complete Left   RTO in 4 days for recheck and compare radiographs.   Kyannah Climer A. Benjamin Stain PA-C

## 2015-07-28 ENCOUNTER — Ambulatory Visit (INDEPENDENT_AMBULATORY_CARE_PROVIDER_SITE_OTHER): Payer: 59

## 2015-07-28 ENCOUNTER — Other Ambulatory Visit: Payer: Self-pay | Admitting: Nurse Practitioner

## 2015-07-28 ENCOUNTER — Ambulatory Visit (INDEPENDENT_AMBULATORY_CARE_PROVIDER_SITE_OTHER): Payer: 59 | Admitting: Nurse Practitioner

## 2015-07-28 ENCOUNTER — Encounter: Payer: Self-pay | Admitting: Nurse Practitioner

## 2015-07-28 ENCOUNTER — Encounter (INDEPENDENT_AMBULATORY_CARE_PROVIDER_SITE_OTHER): Payer: Self-pay

## 2015-07-28 VITALS — BP 128/84 | HR 80 | Temp 98.2°F | Ht 66.0 in | Wt 138.0 lb

## 2015-07-28 DIAGNOSIS — M79672 Pain in left foot: Secondary | ICD-10-CM | POA: Diagnosis not present

## 2015-07-28 DIAGNOSIS — R52 Pain, unspecified: Secondary | ICD-10-CM | POA: Diagnosis not present

## 2015-07-28 NOTE — Progress Notes (Signed)
   Subjective:    Patient ID: Emily Smith, female    DOB: [birth year], 29 y.o.   MRN: [id number]  HPI Patient was skin boarding at beach 2 weeks ago and fell and hurt her left foot. Still painful to walk on. Mild swelling.    Review of Systems  Constitutional: Negative.   HENT: Negative.   Respiratory: Negative.   Cardiovascular: Negative.   Gastrointestinal: Negative.   Genitourinary: Negative.   Neurological: Negative.   Psychiatric/Behavioral: Negative.   All other systems reviewed and are negative.      Objective:   Physical Exam  Constitutional: She is oriented to person, place, and time. She appears well-developed and well-nourished.  Cardiovascular: Normal rate, regular rhythm and normal heart sounds.   Neurological: She is alert and oriented to person, place, and time.  Skin: Skin is warm.  Psychiatric: She has a normal mood and affect. Her behavior is normal. Judgment and thought content normal.    BP 128/84 mmHg  Pulse 80  Temp(Src) 98.2 F (36.8 C) (Oral)  Ht 5\' 6"  (1.676 m)  Wt 138 lb (62.596 kg)  BMI 22.28 kg/m2   Left foot x ray- faint nondispalced cuboid fracture-Preliminary reading by Ronnald Collum, FNP  [facility]        Assessment & Plan:   1. Left foot pain    Will wait on radiology report  Mary-Margaret Hassell Done, FNP

## 2015-09-25 ENCOUNTER — Other Ambulatory Visit: Payer: Self-pay | Admitting: Physician Assistant

## 2015-09-25 MED ORDER — ETONOGESTREL-ETHINYL ESTRADIOL 0.12-0.015 MG/24HR VA RING: VAGINAL_RING | VAGINAL | Status: DC

## 2015-10-13 ENCOUNTER — Ambulatory Visit (INDEPENDENT_AMBULATORY_CARE_PROVIDER_SITE_OTHER): Payer: 59 | Admitting: Family Medicine

## 2015-10-13 VITALS — Temp 97.1°F | Ht 66.0 in | Wt 139.0 lb

## 2015-10-13 DIAGNOSIS — J029 Acute pharyngitis, unspecified: Secondary | ICD-10-CM | POA: Diagnosis not present

## 2015-10-13 DIAGNOSIS — R509 Fever, unspecified: Secondary | ICD-10-CM | POA: Diagnosis not present

## 2015-10-13 DIAGNOSIS — R059 Cough, unspecified: Secondary | ICD-10-CM

## 2015-10-13 DIAGNOSIS — R05 Cough: Secondary | ICD-10-CM | POA: Diagnosis not present

## 2015-10-13 LAB — POCT INFLUENZA A/B
INFLUENZA A, POC: NEGATIVE
INFLUENZA B, POC: NEGATIVE

## 2015-10-13 LAB — POCT RAPID STREP A (OFFICE): RAPID STREP A SCREEN: NEGATIVE

## 2015-10-13 MED ORDER — AMOXICILLIN-POT CLAVULANATE 875-125 MG PO TABS: 1.0000 | ORAL_TABLET | Freq: Two times a day (BID) | ORAL | Status: DC

## 2015-10-13 NOTE — Progress Notes (Signed)
Subjective:  Patient ID: Emily Smith, female    DOB: January 31, 1986  Age: 29 y.o. MRN: [id number]  CC: URI   HPI GOLDYE TOURANGEAU presents for 2-3 days of increasing cough, muscle aches at mid back. Congested nose. Rattling cough, but nonproductive.  History Oona has a past medical history of Mood changes; Thyroid cancer (2013); Squamous cell skin cancer, thigh (2012); Seizures; and Hoarseness.   She has past surgical history that includes throidectomy (03/22/2012); Thyroidectomy (03/22/2012); and Node dissection (03/22/2012).   Her family history includes Lung cancer in her paternal grandfather; Thyroid cancer in her mother. There is no history of Anesthesia problems.She reports that she quit smoking about 4 years ago. Her smoking use included Cigarettes. She quit after 5 years of use. She has never used smokeless tobacco. She reports that she drinks about 0.6 oz of alcohol per week. She reports that she does not use illicit drugs.  Outpatient Prescriptions Prior to Visit  Medication Sig Dispense Refill  . amphetamine-dextroamphetamine (ADDERALL) 20 MG tablet Take 1 tablet (20 mg total) by mouth 2 (two) times daily. 60 tablet 0  . cetirizine (ZYRTEC) 10 MG tablet Take 1 tablet BID for itch 30 tablet 11  . levothyroxine (SYNTHROID, LEVOTHROID) 200 MCG tablet Take 1 tablet (200 mcg total) by mouth daily before breakfast. 30 tablet 0  . triamcinolone cream (KENALOG) 0.1 % Apply 1 application topically 2 (two) times daily. 80 g 0  . etonogestrel-ethinyl estradiol (NUVARING) 0.12-0.015 MG/24HR vaginal ring Insert vaginally and leave in place for 3 consecutive weeks, then remove for 1 week. 1 each 12  . fluticasone (FLONASE) 50 MCG/ACT nasal spray Place 2 sprays into both nostrils daily. (Patient not taking: Reported on 07/17/2015) 16 g 6  . fluconazole (DIFLUCAN) 150 MG tablet Take 1 tablet (150 mg total) by mouth once. (Patient not taking: Reported on 07/17/2015) 2 tablet 0   No  facility-administered medications prior to visit.    ROS Review of Systems  Constitutional: Negative for fever, chills, activity change and appetite change.  HENT: Positive for congestion, postnasal drip, rhinorrhea and sinus pressure. Negative for ear discharge, ear pain, hearing loss, nosebleeds, sneezing and trouble swallowing.   Respiratory: Negative for chest tightness and shortness of breath.   Cardiovascular: Negative for chest pain and palpitations.  Skin: Negative for rash.    Objective:  Temp(Src) 97.1 F (36.2 C) (Oral)  Ht 5\' 6"  (1.676 m)  Wt 139 lb (63.05 kg)  BMI 22.45 kg/m2  BP Readings from Last 3 Encounters:  07/28/15 128/84  07/17/15 102/67  04/15/15 109/72    Wt Readings from Last 3 Encounters:  10/13/15 139 lb (63.05 kg)  07/28/15 138 lb (62.596 kg)  07/17/15 140 lb 6.4 oz (63.685 kg)     Physical Exam  Constitutional: She appears well-developed and well-nourished.  HENT:  Head: Normocephalic and atraumatic.  Right Ear: Tympanic membrane and external ear normal. No decreased hearing is noted.  Left Ear: Tympanic membrane and external ear normal. No decreased hearing is noted.  Nose: Mucosal edema present. Right sinus exhibits no frontal sinus tenderness. Left sinus exhibits no frontal sinus tenderness.  Mouth/Throat: No oropharyngeal exudate or posterior oropharyngeal erythema.  Neck: No Brudzinski's sign noted.  Pulmonary/Chest: Breath sounds normal. No respiratory distress.  Lymphadenopathy:       Head (right side): No preauricular adenopathy present.       Head (left side): No preauricular adenopathy present.       Right cervical: No superficial  cervical adenopathy present.      Left cervical: No superficial cervical adenopathy present.    No results found for: HGBA1C  Lab Results  Component Value Date   WBC 8.5 05/10/2014   HGB 13.3 05/10/2014   HCT 41.5 05/10/2014   PLT 290 03/14/2012   GLUCOSE 90 05/10/2014   ALT 13 05/10/2014   AST  17 05/10/2014   NA 143 05/10/2014   K 4.1 05/10/2014   CL 104 05/10/2014   CREATININE 0.86 05/10/2014   BUN 13 05/10/2014   CO2 25 05/10/2014   TSH 61.200* 07/17/2014    Nm Whole Body I131 Scan W/thyrogen  08/09/2014  CLINICAL DATA:  Papillary thyroid cancer with extracapsularextension. Node negative. The patient received 132 mCi adjuvantI 131 therapy on 06/07/2012 following a total thyroidectomy. EXAM: THYROGEN-STIMULATED I-131 WHOLE BODY SCAN TECHNIQUE: The patient received 0.9 mg Thyrogen intramuscularly every 24 hours for two doses. On the third day the patient returned and received the radiopharmaceutical, per orally. On the fifth day, the patient returned and whole body planar images were obtained in the anterior and posterior projections. RADIOPHARMACEUTICALS:  4.0 mCiI-131 sodium iodide COMPARISON:  Whole-body scan 07/27/2013 FINDINGS: No residual or recurrent activity within the thyroid bed. No abnormal uptake on the whole-body scan. Physiologic uptake noted in the stomach, bowel and bladder. IMPRESSION: No evidence of local thyroid cancer recurrence or distant metastasis. Electronically Signed   By: Suzy Bouchard M.D.   On: 08/09/2014 15:25    Assessment & Plan:   Beretta was seen today for uri.  Diagnoses and all orders for this visit:  Cough -     POCT rapid strep A -     POCT Influenza A/B  Fever, unspecified -     POCT rapid strep A -     POCT Influenza A/B  Sore throat -     POCT Influenza A/B  Other orders -     amoxicillin-clavulanate (AUGMENTIN) 875-125 MG tablet; Take 1 tablet by mouth 2 (two) times daily.   I have discontinued Ms. Gorney's fluconazole and etonogestrel-ethinyl estradiol. I am also having her start on amoxicillin-clavulanate. Additionally, I am having her maintain her amphetamine-dextroamphetamine, fluticasone, triamcinolone cream, cetirizine, and levothyroxine.  Meds ordered this encounter  Medications  . amoxicillin-clavulanate (AUGMENTIN)  875-125 MG tablet    Sig: Take 1 tablet by mouth 2 (two) times daily.    Dispense:  20 tablet    Refill:  0     Follow-up: Return if symptoms worsen or fail to improve.  Claretta Fraise, M.D.

## 2015-10-19 ENCOUNTER — Encounter: Payer: Self-pay | Admitting: Family Medicine

## 2015-11-11 ENCOUNTER — Encounter: Payer: Self-pay | Admitting: Family Medicine

## 2015-11-11 ENCOUNTER — Ambulatory Visit (INDEPENDENT_AMBULATORY_CARE_PROVIDER_SITE_OTHER): Payer: 59 | Admitting: Family Medicine

## 2015-11-11 VITALS — BP 118/71 | HR 73 | Temp 97.4°F | Ht 66.0 in | Wt 141.6 lb

## 2015-11-11 DIAGNOSIS — F909 Attention-deficit hyperactivity disorder, unspecified type: Secondary | ICD-10-CM

## 2015-11-11 DIAGNOSIS — F988 Other specified behavioral and emotional disorders with onset usually occurring in childhood and adolescence: Secondary | ICD-10-CM

## 2015-11-11 MED ORDER — LISDEXAMFETAMINE DIMESYLATE 30 MG PO CAPS: 30.0000 mg | ORAL_CAPSULE | Freq: Every day | ORAL | Status: DC

## 2015-11-11 NOTE — Progress Notes (Signed)
   Subjective:    Patient ID: Emily Smith, female    DOB: Jun 25, 1986, 29 y.o.   MRN: [id number]  HPI Patient here today for ADD follow up. Takes Adderall once a day. Afraid to take evening dose for fear of insomnia. She would like to try similar medicine that is once a day. We discussed options and will try Vyvanse 30 mg once a day may have to titrate depending on response.   Review of Systems Patient Active Problem List   Diagnosis Date Noted  . ADHD (attention deficit hyperactivity disorder) 03/05/2015   Outpatient Encounter Prescriptions as of 11/11/2015  Medication Sig  . amphetamine-dextroamphetamine (ADDERALL) 20 MG tablet Take 1 tablet (20 mg total) by mouth 2 (two) times daily.  . cetirizine (ZYRTEC) 10 MG tablet Take 1 tablet BID for itch  . fluticasone (FLONASE) 50 MCG/ACT nasal spray Place 2 sprays into both nostrils daily.  Marland Kitchen levothyroxine (SYNTHROID, LEVOTHROID) 200 MCG tablet Take 1 tablet (200 mcg total) by mouth daily before breakfast.  . triamcinolone cream (KENALOG) 0.1 % Apply 1 application topically 2 (two) times daily.  . [DISCONTINUED] amoxicillin-clavulanate (AUGMENTIN) 875-125 MG tablet Take 1 tablet by mouth 2 (two) times daily.   No facility-administered encounter medications on file as of 11/11/2015.        Objective:   Physical Exam  Constitutional: She is oriented to person, place, and time. She appears well-developed and well-nourished.  HENT:  Head: Normocephalic.  Neurological: She is alert and oriented to person, place, and time.  Psychiatric: She has a normal mood and affect. Her behavior is normal.    BP 118/71 mmHg  Pulse 73  Temp(Src) 97.4 F (36.3 C) (Oral)  Ht 5\' 6"  (1.676 m)  Wt 141 lb 9.6 oz (64.229 kg)  BMI 22.87 kg/m2         Assessment & Plan:  1. ADD (attention deficit disorder) Continue Adderall and begin Vyvanse 30 mg daily and follow  Wardell Honour MD

## 2015-12-10 ENCOUNTER — Other Ambulatory Visit: Payer: Self-pay

## 2015-12-10 MED ORDER — ETONOGESTREL-ETHINYL ESTRADIOL 0.12-0.015 MG/24HR VA RING: VAGINAL_RING | VAGINAL | Status: DC

## 2015-12-31 ENCOUNTER — Telehealth: Payer: Self-pay

## 2015-12-31 DIAGNOSIS — F909 Attention-deficit hyperactivity disorder, unspecified type: Secondary | ICD-10-CM

## 2015-12-31 NOTE — Telephone Encounter (Signed)
Patient says that meds are not working. Do you want to increase the new medication or change back to the old. Please advise and route to pool B

## 2015-12-31 NOTE — Telephone Encounter (Signed)
May need to increase dose

## 2016-01-02 MED ORDER — AMPHETAMINE-DEXTROAMPHETAMINE 20 MG PO TABS: 20.0000 mg | ORAL_TABLET | Freq: Two times a day (BID) | ORAL | Status: DC

## 2016-01-02 NOTE — Telephone Encounter (Signed)
Patient aware that rx is ready to be picked up.  

## 2016-01-02 NOTE — Telephone Encounter (Signed)
Patient wants to go back on adderall. Please print rx and ROUTE TO POOL A so nurse can call patient to pick up

## 2016-02-27 ENCOUNTER — Other Ambulatory Visit: Payer: Self-pay | Admitting: *Deleted

## 2016-02-27 MED ORDER — HYDROCORTISONE 2.5 % RE CREA: 1.0000 "application " | TOPICAL_CREAM | Freq: Two times a day (BID) | RECTAL | Status: DC

## 2016-03-19 ENCOUNTER — Encounter: Payer: Self-pay | Admitting: Family Medicine

## 2016-03-19 ENCOUNTER — Ambulatory Visit (INDEPENDENT_AMBULATORY_CARE_PROVIDER_SITE_OTHER): Payer: 59 | Admitting: Family Medicine

## 2016-03-19 VITALS — BP 109/61 | HR 81 | Temp 97.8°F | Ht 66.0 in | Wt 144.8 lb

## 2016-03-19 DIAGNOSIS — B9789 Other viral agents as the cause of diseases classified elsewhere: Secondary | ICD-10-CM

## 2016-03-19 DIAGNOSIS — R6883 Chills (without fever): Secondary | ICD-10-CM | POA: Diagnosis not present

## 2016-03-19 DIAGNOSIS — J028 Acute pharyngitis due to other specified organisms: Secondary | ICD-10-CM

## 2016-03-19 DIAGNOSIS — J029 Acute pharyngitis, unspecified: Secondary | ICD-10-CM

## 2016-03-19 LAB — VERITOR FLU A/B WAIVED
INFLUENZA B: NEGATIVE
Influenza A: NEGATIVE

## 2016-03-19 NOTE — Progress Notes (Signed)
BP 109/61 mmHg  Pulse 81  Temp(Src) 97.8 F (36.6 C) (Oral)  Ht 5\' 6"  (1.676 m)  Wt 144 lb 12.8 oz (65.681 kg)  BMI 23.38 kg/m2  LMP 02/22/2016   Subjective:    Patient ID: [name], female    DOB: September 30, 1986, 30 y.o.   MRN: [id number]  HPI: Emily Smith is a 30 y.o. female presenting on 03/19/2016 for Nasal Congestion; Headache; and Chills   HPI Cough and chills and nasal congestion Patient presents today with cough and chills and nasal congestion and postnasal drainage that is been going on since yesterday. She denies any sick contacts that she knows of but they did just have a major funeral home family where everybody was together. She denies any shortness of breath or wheezing. She has tried some over-the-counter Vicks sinus and cold. It helped a little bit but not much.  Relevant past medical, surgical, family and social history reviewed and updated as indicated. Interim medical history since our last visit reviewed. Allergies and medications reviewed and updated.  Review of Systems  Constitutional: Positive for chills. Negative for fever.  HENT: Positive for congestion, postnasal drip, rhinorrhea, sinus pressure, sneezing and sore throat. Negative for ear discharge and ear pain.   Eyes: Negative for pain, redness and visual disturbance.  Respiratory: Positive for cough. Negative for chest tightness and shortness of breath.   Cardiovascular: Negative for chest pain and leg swelling.  Genitourinary: Negative for dysuria and difficulty urinating.  Musculoskeletal: Positive for myalgias. Negative for back pain and gait problem.  Skin: Negative for rash.  Neurological: Negative for light-headedness and headaches.  Psychiatric/Behavioral: Negative for behavioral problems and agitation.  All other systems reviewed and are negative.   Per HPI unless specifically indicated above     Medication List       This list is accurate as of: 03/19/16  2:14 PM.  Always use  your most recent med list.               amphetamine-dextroamphetamine 20 MG tablet  Commonly known as:  ADDERALL  Take 1 tablet (20 mg total) by mouth 2 (two) times daily.     etonogestrel-ethinyl estradiol 0.12-0.015 MG/24HR vaginal ring  Commonly known as:  NUVARING  Insert vaginally and leave in place for 3 consecutive weeks, then remove for 1 week.     levothyroxine 200 MCG tablet  Commonly known as:  SYNTHROID, LEVOTHROID  Take 1 tablet (200 mcg total) by mouth daily before breakfast.           Objective:    BP 109/61 mmHg  Pulse 81  Temp(Src) 97.8 F (36.6 C) (Oral)  Ht 5\' 6"  (1.676 m)  Wt 144 lb 12.8 oz (65.681 kg)  BMI 23.38 kg/m2  LMP 02/22/2016  Wt Readings from Last 3 Encounters:  03/19/16 144 lb 12.8 oz (65.681 kg)  11/11/15 141 lb 9.6 oz (64.229 kg)  10/13/15 139 lb (63.05 kg)    Physical Exam  Constitutional: She is oriented to person, place, and time. She appears well-developed and well-nourished. No distress.  HENT:  Right Ear: Tympanic membrane, external ear and ear canal normal.  Left Ear: Tympanic membrane, external ear and ear canal normal.  Nose: Mucosal edema and rhinorrhea present. No epistaxis. Right sinus exhibits no maxillary sinus tenderness and no frontal sinus tenderness. Left sinus exhibits no maxillary sinus tenderness and no frontal sinus tenderness.  Mouth/Throat: Uvula is midline and mucous membranes are normal. Posterior oropharyngeal edema  and posterior oropharyngeal erythema present. No oropharyngeal exudate or tonsillar abscesses.  Eyes: Conjunctivae and EOM are normal.  Neck: Neck supple. No thyromegaly present.  Cardiovascular: Normal rate, regular rhythm, normal heart sounds and intact distal pulses.   No murmur heard. Pulmonary/Chest: Effort normal and breath sounds normal. No respiratory distress. She has no wheezes.  Musculoskeletal: Normal range of motion. She exhibits no edema or tenderness.  Lymphadenopathy:    She has  no cervical adenopathy.  Neurological: She is alert and oriented to person, place, and time. Coordination normal.  Skin: Skin is warm and dry. No rash noted. She is not diaphoretic.  Psychiatric: She has a normal mood and affect. Her behavior is normal.  Nursing note and vitals reviewed.   Influenza negative    Assessment & Plan:   Problem List Items Addressed This Visit    None    Visit Diagnoses    Chills    -  Primary    Relevant Orders    Veritor Flu A/B Waived    Acute viral pharyngitis           Follow up plan: Return if symptoms worsen or fail to improve.  Counseling provided for all of the vaccine components Orders Placed This Encounter  Procedures  . Veritor Flu A/B Ar[place]ro[place], MD [facility] 03/19/2016, 2:14 PM

## 2016-03-19 NOTE — Patient Instructions (Signed)
Thank you for allowing us to care for you today. We strive to provide exceptional quality and compassionate care. Please let us know how we are doing and how we can help serve you better by filling out the survey that you receive from [employer].     

## 2016-03-26 ENCOUNTER — Telehealth: Payer: Self-pay | Admitting: *Deleted

## 2016-03-26 DIAGNOSIS — F909 Attention-deficit hyperactivity disorder, unspecified type: Secondary | ICD-10-CM

## 2016-03-26 MED ORDER — AMOXICILLIN-POT CLAVULANATE 875-125 MG PO TABS
1.0000 | ORAL_TABLET | Freq: Two times a day (BID) | ORAL | Status: DC
Start: 2016-03-26 — End: 2016-07-12

## 2016-03-26 MED ORDER — AMPHETAMINE-DEXTROAMPHETAMINE 20 MG PO TABS: 20.0000 mg | ORAL_TABLET | Freq: Two times a day (BID) | ORAL | Status: DC

## 2016-03-26 NOTE — Telephone Encounter (Signed)
Okay to refill as patient requests?  

## 2016-03-26 NOTE — Telephone Encounter (Signed)
Pt aware Rx's ready

## 2016-04-07 ENCOUNTER — Other Ambulatory Visit: Payer: 59

## 2016-04-07 ENCOUNTER — Encounter (INDEPENDENT_AMBULATORY_CARE_PROVIDER_SITE_OTHER): Payer: Self-pay

## 2016-04-07 ENCOUNTER — Other Ambulatory Visit: Payer: Self-pay | Admitting: *Deleted

## 2016-04-07 DIAGNOSIS — E039 Hypothyroidism, unspecified: Secondary | ICD-10-CM

## 2016-04-08 LAB — TSH: TSH: 2.33 u[IU]/mL (ref 0.450–4.500)

## 2016-04-08 LAB — THYROGLOBULIN ANTIBODY: Thyroglobulin Antibody: <1 IU/mL (ref 0.0–0.9)

## 2016-05-20 ENCOUNTER — Ambulatory Visit: Payer: 59 | Admitting: Family Medicine

## 2016-05-21 ENCOUNTER — Encounter: Payer: Self-pay | Admitting: Family Medicine

## 2016-06-03 ENCOUNTER — Encounter: Payer: Self-pay | Admitting: Family Medicine

## 2016-06-03 ENCOUNTER — Ambulatory Visit (INDEPENDENT_AMBULATORY_CARE_PROVIDER_SITE_OTHER): Payer: 59 | Admitting: Family Medicine

## 2016-06-03 VITALS — BP 118/79 | HR 70 | Temp 97.4°F | Ht 66.0 in | Wt 147.2 lb

## 2016-06-03 DIAGNOSIS — F909 Attention-deficit hyperactivity disorder, unspecified type: Secondary | ICD-10-CM

## 2016-06-03 MED ORDER — AMPHETAMINE-DEXTROAMPHETAMINE 20 MG PO TABS: 20.0000 mg | ORAL_TABLET | Freq: Two times a day (BID) | ORAL | Status: DC

## 2016-06-03 MED ORDER — AMPHETAMINE-DEXTROAMPHET ER 20 MG PO CP24: 20.0000 mg | ORAL_CAPSULE | Freq: Two times a day (BID) | ORAL | Status: DC

## 2016-06-03 NOTE — Progress Notes (Signed)
   Subjective:    Patient ID: Emily Smith, female    DOB: 03/04/1986, 30 y.o.   MRN: [id number]  HPI 30 year old female who takes Adderall 20 mg twice a day. No problems with weight loss, insomnia, palpitations, or hypertension. She tries to leave medicines off on days that she is not working and feels like she is not very productive because of a lack of focus.  Patient Active Problem List   Diagnosis Date Noted  . ADHD (attention deficit hyperactivity disorder) 03/05/2015   Outpatient Encounter Prescriptions as of 06/03/2016  Medication Sig  . amoxicillin-clavulanate (AUGMENTIN) 875-125 MG tablet Take 1 tablet by mouth 2 (two) times daily.  Marland Kitchen amphetamine-dextroamphetamine (ADDERALL) 20 MG tablet Take 1 tablet (20 mg total) by mouth 2 (two) times daily.  Marland Kitchen amphetamine-dextroamphetamine (ADDERALL) 20 MG tablet Take 1 tablet (20 mg total) by mouth 2 (two) times daily.  Marland Kitchen amphetamine-dextroamphetamine (ADDERALL) 20 MG tablet Take 1 tablet (20 mg total) by mouth 2 (two) times daily.  Marland Kitchen etonogestrel-ethinyl estradiol (NUVARING) 0.12-0.015 MG/24HR vaginal ring Insert vaginally and leave in place for 3 consecutive weeks, then remove for 1 week.  . levothyroxine (SYNTHROID, LEVOTHROID) 200 MCG tablet Take 1 tablet (200 mcg total) by mouth daily before breakfast.  . [DISCONTINUED] amphetamine-dextroamphetamine (ADDERALL XR) 20 MG 24 hr capsule Take 1 capsule (20 mg total) by mouth 2 (two) times daily.  . [DISCONTINUED] amphetamine-dextroamphetamine (ADDERALL) 20 MG tablet Take 1 tablet (20 mg total) by mouth 2 (two) times daily.  . [DISCONTINUED] amphetamine-dextroamphetamine (ADDERALL) 20 MG tablet Take 1 tablet (20 mg total) by mouth 2 (two) times daily.  . [DISCONTINUED] amphetamine-dextroamphetamine (ADDERALL) 20 MG tablet Take 1 tablet (20 mg total) by mouth 2 (two) times daily.   No facility-administered encounter medications on file as of 06/03/2016.      Review of Systems  Constitutional:  Negative.   HENT: Negative.   Eyes: Negative.   Respiratory: Negative.   Cardiovascular: Negative.   Gastrointestinal: Negative.   Endocrine: Negative.   Genitourinary: Negative.   Hematological: Negative.   Psychiatric/Behavioral: Negative.        Objective:   Physical Exam  Constitutional: She is oriented to person, place, and time. She appears well-developed and well-nourished.  Cardiovascular: Normal rate and regular rhythm.   Pulmonary/Chest: Effort normal and breath sounds normal.  Neurological: She is alert and oriented to person, place, and time.  Psychiatric: She has a normal mood and affect. Her behavior is normal.   BP 118/79 mmHg  Pulse 70  Temp(Src) 97.4 F (36.3 C) (Oral)  Ht 5\' 6"  (1.676 m)  Wt 147 lb 3.2 oz (66.769 kg)  BMI 23.77 kg/m2        Assessment & Plan:  1. Attention deficit hyperactivity disorder (ADHD), unspecified ADHD type Tolerating the medicines and they are effective. - amphetamine-dextroamphetamine (ADDERALL) 20 MG tablet; Take 1 tablet (20 mg total) by mouth 2 (two) times daily.  Dispense: 60 tablet; Refill: 0  Wardell Honour MD

## 2016-07-12 ENCOUNTER — Ambulatory Visit (INDEPENDENT_AMBULATORY_CARE_PROVIDER_SITE_OTHER): Payer: 59 | Admitting: Family Medicine

## 2016-07-12 ENCOUNTER — Encounter: Payer: Self-pay | Admitting: Family Medicine

## 2016-07-12 VITALS — BP 113/75 | HR 72 | Temp 97.7°F | Ht 66.0 in | Wt 147.0 lb

## 2016-07-12 DIAGNOSIS — M791 Myalgia, unspecified site: Secondary | ICD-10-CM

## 2016-07-12 DIAGNOSIS — R5383 Other fatigue: Secondary | ICD-10-CM | POA: Diagnosis not present

## 2016-07-12 LAB — URINALYSIS
BILIRUBIN UA: NEGATIVE
Glucose, UA: NEGATIVE
Ketones, UA: NEGATIVE
LEUKOCYTES UA: NEGATIVE
Nitrite, UA: NEGATIVE
PH UA: 7.5 (ref 5.0–7.5)
PROTEIN UA: NEGATIVE
Specific Gravity, UA: 1.02 (ref 1.005–1.030)
Urobilinogen, Ur: 0.2 mg/dL (ref 0.2–1.0)

## 2016-07-12 LAB — PREGNANCY, URINE: PREG TEST UR: NEGATIVE

## 2016-07-12 NOTE — Progress Notes (Signed)
BP 113/75 mmHg  Pulse 72  Temp(Src) 97.7 F (36.5 C) (Oral)  Ht _0  (1.676 m)  Wt 147 lb (66.679 kg)  BMI 23.74 kg/m2   Subjective:    Patient ID: Emily Smith, female    DOB: 09-11-1986, 30 y.o.   MRN: [id number]  HPI: Emily Smith is a 31 y.o. female presenting on 07/12/2016 for Fatigue and Muscle Pain   HPI Fatigue and muscle aches Patient has been having fatigue and generalized muscle aches and pains that are worse in the legs than the upper body. She denies any fevers or chills or any recent tick bites or any recent colds. She denies any abdominal pain or dysuria or diarrhea or constipation. She denies any rashes or skin changes.  Relevant past medical, surgical, family and social history reviewed and updated as indicated. Interim medical history since our last visit reviewed. Allergies and medications reviewed and updated.  Review of Systems  Constitutional: Positive for fatigue. Negative for chills and fever.  HENT: Negative for congestion, ear discharge and ear pain.   Eyes: Negative for redness and visual disturbance.  Respiratory: Negative for chest tightness and shortness of breath.   Cardiovascular: Negative for chest pain and leg swelling.  Gastrointestinal: Negative for abdominal pain.  Genitourinary: Negative for difficulty urinating and dysuria.  Musculoskeletal: Positive for myalgias. Negative for back pain and gait problem.  Skin: Negative for color change and rash.  Neurological: Negative for light-headedness, numbness and headaches.  Psychiatric/Behavioral: Negative for agitation and behavioral problems.  All other systems reviewed and are negative.   Per HPI unless specifically indicated above     Medication List       This list is accurate as of: 07/12/16  6:46 PM.  Always use your most recent med list.               amphetamine-dextroamphetamine 20 MG tablet  Commonly known as:  ADDERALL  Take 1 tablet (20 mg total) by mouth 2 (two)  times daily.     amphetamine-dextroamphetamine 20 MG tablet  Commonly known as:  ADDERALL  Take 1 tablet (20 mg total) by mouth 2 (two) times daily.     amphetamine-dextroamphetamine 20 MG tablet  Commonly known as:  ADDERALL  Take 1 tablet (20 mg total) by mouth 2 (two) times daily.     etonogestrel-ethinyl estradiol 0.12-0.015 MG/24HR vaginal ring  Commonly known as:  NUVARING  Insert vaginally and leave in place for 3 consecutive weeks, then remove for 1 week.     levothyroxine 200 MCG tablet  Commonly known as:  SYNTHROID, LEVOTHROID  Take 1 tablet (200 mcg total) by mouth daily before breakfast.           Objective:    BP 113/75 mmHg  Pulse 72  Temp(Src) 97.7 F (36.5 C) (Oral)  Ht _1  (1.676 m)  Wt 147 lb (66.679 kg)  BMI 23.74 kg/m2  Wt Readings from Last 3 Encounters:  07/12/16 147 lb (66.679 kg)  06/03/16 147 lb 3.2 oz (66.769 kg)  03/19/16 144 lb 12.8 oz (65.681 kg)    Physical Exam  Constitutional: She is oriented to person, place, and time. She appears well-developed and well-nourished. No distress.  Eyes: Conjunctivae and EOM are normal. Pupils are equal, round, and reactive to light.  Cardiovascular: Normal rate, regular rhythm, normal heart sounds and intact distal pulses.   No murmur heard. Pulmonary/Chest: Effort normal and breath sounds normal. No respiratory distress. She has no  wheezes.  Musculoskeletal: Normal range of motion. She exhibits no edema or tenderness.  Muscle aches and tenderness throughout.  Neurological: She is alert and oriented to person, place, and time. Coordination normal.  Skin: Skin is warm and dry. No lesion and no rash noted. She is not diaphoretic.  Psychiatric: She has a normal mood and affect. Her behavior is normal.  Nursing note and vitals reviewed.     Assessment & Plan:   Problem List Items Addressed This Visit    None    Visit Diagnoses    Myalgia    -  Primary   Relevant Orders   Urinalysis (Completed)    Pregnancy, urine (Completed)   CBC with Differential/Platelet (Completed)   CMP14+EGFR (Completed)   TSH (Completed)   Sedimentation rate (Completed)   C-reactive protein (Completed)   ANA Comprehensive Panel (Completed)   Other fatigue       Relevant Orders   Urinalysis (Completed)   Pregnancy, urine (Completed)   CBC with Differential/Platelet (Completed)   CMP14+EGFR (Completed)   TSH (Completed)   Sedimentation rate (Completed)   C-reactive protein (Completed)   ANA Comprehensive Panel (Completed)       Follow up plan: Return if symptoms worsen or fail to improve.  Counseling provided for all of the vaccine components Orders Placed This Encounter  Procedures  . Urinalysis  . Pregnancy, urine  . CBC with Differential/Platelet  . CMP14+EGFR  . TSH  . Sedimentation rate  . C-reactive protein  . Dassel Emily Gillentine, MD [facility] 07/12/2016, 6:46 PM

## 2016-07-14 LAB — CMP14+EGFR
ALBUMIN: 4 g/dL (ref 3.5–5.5)
ALK PHOS: 40 IU/L (ref 39–117)
ALT: 12 IU/L (ref 0–32)
AST: 17 IU/L (ref 0–40)
Albumin/Globulin Ratio: 1.7 (ref 1.2–2.2)
BUN/Creatinine Ratio: 13 (ref 9–23)
BUN: 11 mg/dL (ref 6–20)
Bilirubin Total: 0.5 mg/dL (ref 0.0–1.2)
CALCIUM: 8.9 mg/dL (ref 8.7–10.2)
CO2: 20 mmol/L (ref 18–29)
CREATININE: 0.84 mg/dL (ref 0.57–1.00)
Chloride: 101 mmol/L (ref 96–106)
GFR calc Af Amer: 109 mL/min/{1.73_m2} (ref >59)
GFR calc non Af Amer: 94 mL/min/{1.73_m2} (ref >59)
GLUCOSE: 80 mg/dL (ref 65–99)
Globulin, Total: 2.3 g/dL (ref 1.5–4.5)
Potassium: 4.8 mmol/L (ref 3.5–5.2)
Sodium: 141 mmol/L (ref 134–144)
Total Protein: 6.3 g/dL (ref 6.0–8.5)

## 2016-07-14 LAB — CBC WITH DIFFERENTIAL/PLATELET
BASOS ABS: 0 10*3/uL (ref 0.0–0.2)
Basos: 1 %
EOS (ABSOLUTE): 0.1 10*3/uL (ref 0.0–0.4)
Eos: 1 %
HEMOGLOBIN: 12.7 g/dL (ref 11.1–15.9)
Hematocrit: 38.7 % (ref 34.0–46.6)
IMMATURE GRANULOCYTES: 0 %
Immature Grans (Abs): 0 10*3/uL (ref 0.0–0.1)
LYMPHS ABS: 2 10*3/uL (ref 0.7–3.1)
Lymphs: 32 %
MCH: 30.5 pg (ref 26.6–33.0)
MCHC: 32.8 g/dL (ref 31.5–35.7)
MCV: 93 fL (ref 79–97)
MONOCYTES: 7 %
Monocytes Absolute: 0.4 10*3/uL (ref 0.1–0.9)
NEUTROS PCT: 59 %
Neutrophils Absolute: 3.8 10*3/uL (ref 1.4–7.0)
Platelets: 331 10*3/uL (ref 150–379)
RBC: 4.17 x10E6/uL (ref 3.77–5.28)
RDW: 13.4 % (ref 12.3–15.4)
WBC: 6.2 10*3/uL (ref 3.4–10.8)

## 2016-07-14 LAB — ANA COMPREHENSIVE PANEL
ENA RNP Ab: <0.2 AI (ref 0.0–0.9)
ENA SM Ab Ser-aCnc: <0.2 AI (ref 0.0–0.9)
Scleroderma SCL-70: <0.2 AI (ref 0.0–0.9)
dsDNA Ab: <1 IU/mL (ref 0–9)

## 2016-07-14 LAB — C-REACTIVE PROTEIN: CRP: 11 mg/L — ABNORMAL HIGH (ref 0.0–4.9)

## 2016-07-14 LAB — SEDIMENTATION RATE

## 2016-07-14 LAB — TSH: TSH: 0.97 u[IU]/mL (ref 0.450–4.500)

## 2016-07-15 ENCOUNTER — Telehealth: Payer: Self-pay | Admitting: *Deleted

## 2016-07-15 NOTE — Telephone Encounter (Signed)
-----   Message from Sharion Balloon, [employer] sent at 07/15/2016  4:06 PM EDT ----- CBC WNL Kidney and liver function stable Thyroid levels WNL ANA negative Urine negative Pregnancy negative C-reactive protein elevated- Shows inflammation

## 2016-07-15 NOTE — Telephone Encounter (Signed)
Pt notified of results Verbalizes understanding 

## 2016-08-02 ENCOUNTER — Ambulatory Visit (INDEPENDENT_AMBULATORY_CARE_PROVIDER_SITE_OTHER): Payer: 59 | Admitting: Nurse Practitioner

## 2016-08-02 ENCOUNTER — Encounter: Payer: Self-pay | Admitting: Nurse Practitioner

## 2016-08-02 VITALS — BP 123/71 | HR 74 | Temp 97.7°F | Ht 66.0 in | Wt 147.6 lb

## 2016-08-02 DIAGNOSIS — H6012 Cellulitis of left external ear: Secondary | ICD-10-CM | POA: Diagnosis not present

## 2016-08-02 MED ORDER — PREDNISONE 10 MG (21) PO TBPK: ORAL_TABLET | ORAL | 0 refills | Status: DC

## 2016-08-02 NOTE — Progress Notes (Signed)
   Subjective:    Patient ID: Emily Smith, female    DOB: [birth year], 30 y.o.   MRN: [id number]  HPI Patient in today c/o left outer ear pain and swelling. She was out walking with her son last night and felt something bite/sting hre ear- immediately started itching and swelling. Is more swollen today and pain down below ear as well.    Review of Systems  Constitutional: Negative.   HENT: Negative.   Respiratory: Negative.   Cardiovascular: Negative.   Gastrointestinal: Negative.   Genitourinary: Negative.   Neurological: Negative.   Psychiatric/Behavioral: Negative.   All other systems reviewed and are negative.      Objective:   Physical Exam  Constitutional: She is oriented to person, place, and time. She appears well-developed and well-nourished. No distress.  HENT:  Left ear edamatous and erythematous.  Cardiovascular: Normal rate, regular rhythm and normal heart sounds.   Pulmonary/Chest: Effort normal and breath sounds normal.  Neurological: She is alert and oriented to person, place, and time.  Skin: Skin is warm and dry.  Psychiatric: She has a normal mood and affect. Her behavior is normal. Judgment and thought content normal.    BP 123/71 (BP Location: Left Arm, Patient Position: Sitting, Cuff Size: Normal)   Pulse 74   Temp 97.7 F (36.5 C) (Oral)   Ht 5\' 6"  (1.676 m)   Wt 147 lb 9.6 oz (67 kg)   BMI 23.82 kg/m       Assessment & Plan:   1. Cellulitis of external ear, left    Benadryl otc Ice bid Meds ordered this encounter  Medications  . predniSONE (STERAPRED UNI-PAK 21 TAB) 10 MG (21) TBPK tablet    Sig: As directed x 6 days    Dispense:  21 tablet    Refill:  0    Order Specific Question:   Supervising Provider    Answer:   Eustaquio Maize [[id number]]   RTO prn  Mary-Margaret Hassell Done, FNP

## 2016-10-22 ENCOUNTER — Other Ambulatory Visit: Payer: Self-pay | Admitting: Nurse Practitioner

## 2016-10-27 ENCOUNTER — Ambulatory Visit (INDEPENDENT_AMBULATORY_CARE_PROVIDER_SITE_OTHER): Payer: 59 | Admitting: Family Medicine

## 2016-10-27 ENCOUNTER — Encounter: Payer: Self-pay | Admitting: Family Medicine

## 2016-10-27 DIAGNOSIS — F909 Attention-deficit hyperactivity disorder, unspecified type: Secondary | ICD-10-CM

## 2016-10-27 MED ORDER — AMPHETAMINE-DEXTROAMPHETAMINE 20 MG PO TABS: 20.0000 mg | ORAL_TABLET | Freq: Two times a day (BID) | ORAL | 0 refills | Status: DC

## 2016-10-27 NOTE — Progress Notes (Signed)
   Subjective:    Patient ID: Emily Smith, female    DOB: [birth year], 30 y.o.   MRN: [id number]  HPI 30 year old female who is here today for refill on her meds for ADHD. She currently takes Adderall 20 mg twice a day. This seems to be working. Some days if she misses a dose she can tell a difference in her organizational skills and focusing. She reports no side effects, no difficulties with sleeping.  Patient Active Problem List   Diagnosis Date Noted  . ADHD (attention deficit hyperactivity disorder) 03/05/2015   Outpatient Encounter Prescriptions as of 10/27/2016  Medication Sig  . amphetamine-dextroamphetamine (ADDERALL) 20 MG tablet Take 1 tablet (20 mg total) by mouth 2 (two) times daily.  Marland Kitchen amphetamine-dextroamphetamine (ADDERALL) 20 MG tablet Take 1 tablet (20 mg total) by mouth 2 (two) times daily.  Marland Kitchen amphetamine-dextroamphetamine (ADDERALL) 20 MG tablet Take 1 tablet (20 mg total) by mouth 2 (two) times daily.  Marland Kitchen levothyroxine (SYNTHROID, LEVOTHROID) 200 MCG tablet Take 1 tablet (200 mcg total) by mouth daily before breakfast. (Patient taking differently: Take 175 mcg by mouth daily before breakfast. )  . NUVARING 0.12-0.015 MG/24HR vaginal ring INSERT VAGINALLY AND LEAVE IN PLACE FOR 3 CONSECUTIVE WEEKS, THEN REMOVE FOR 1 WEEK  . [DISCONTINUED] predniSONE (STERAPRED UNI-PAK 21 TAB) 10 MG (21) TBPK tablet As directed x 6 days   No facility-administered encounter medications on file as of 10/27/2016.       Review of Systems  Constitutional: Negative.   HENT: Negative.   Eyes: Negative.   Respiratory: Negative.   Cardiovascular: Negative.   Gastrointestinal: Negative.   Endocrine: Negative.   Genitourinary: Negative.   Hematological: Negative.   Psychiatric/Behavioral: Negative.        Objective:   Physical Exam  Constitutional: She is oriented to person, place, and time. She appears well-developed and well-nourished.  Cardiovascular: Normal rate.   Pulmonary/Chest:  Effort normal.  Neurological: She is alert and oriented to person, place, and time.  Psychiatric: She has a normal mood and affect. Her behavior is normal.   BP 115/74   Pulse 76   Temp 98.5 F (36.9 C) (Oral)   Ht 5\' 6"  (1.676 m)   Wt 146 lb (66.2 kg)   LMP 10/20/2016   BMI 23.57 kg/m         Assessment & Plan:  1. Attention deficit hyperactivity disorder (ADHD), unspecified ADHD type Symptoms seem to be well managed the patient was given permission to experiment with dosing her by she may take 1-1/2 tablets in the morning to see if it had any better effects - amphetamine-dextroamphetamine (ADDERALL) 20 MG tablet; Take 1 tablet (20 mg total) by mouth 2 (two) times daily.  Dispense: 60 tablet; Refill: 0  Wardell Honour MD

## 2016-11-16 IMAGING — CR DG FOOT COMPLETE 3+V*L*
3 series · 3 of 3 positions shown · non-contrast
Comparison: None.

CLINICAL DATA: Foot injury while skim boarding, initial encounter

EXAM:
LEFT FOOT - COMPLETE 3+ VIEW

[view not recorded (1 of 3)]
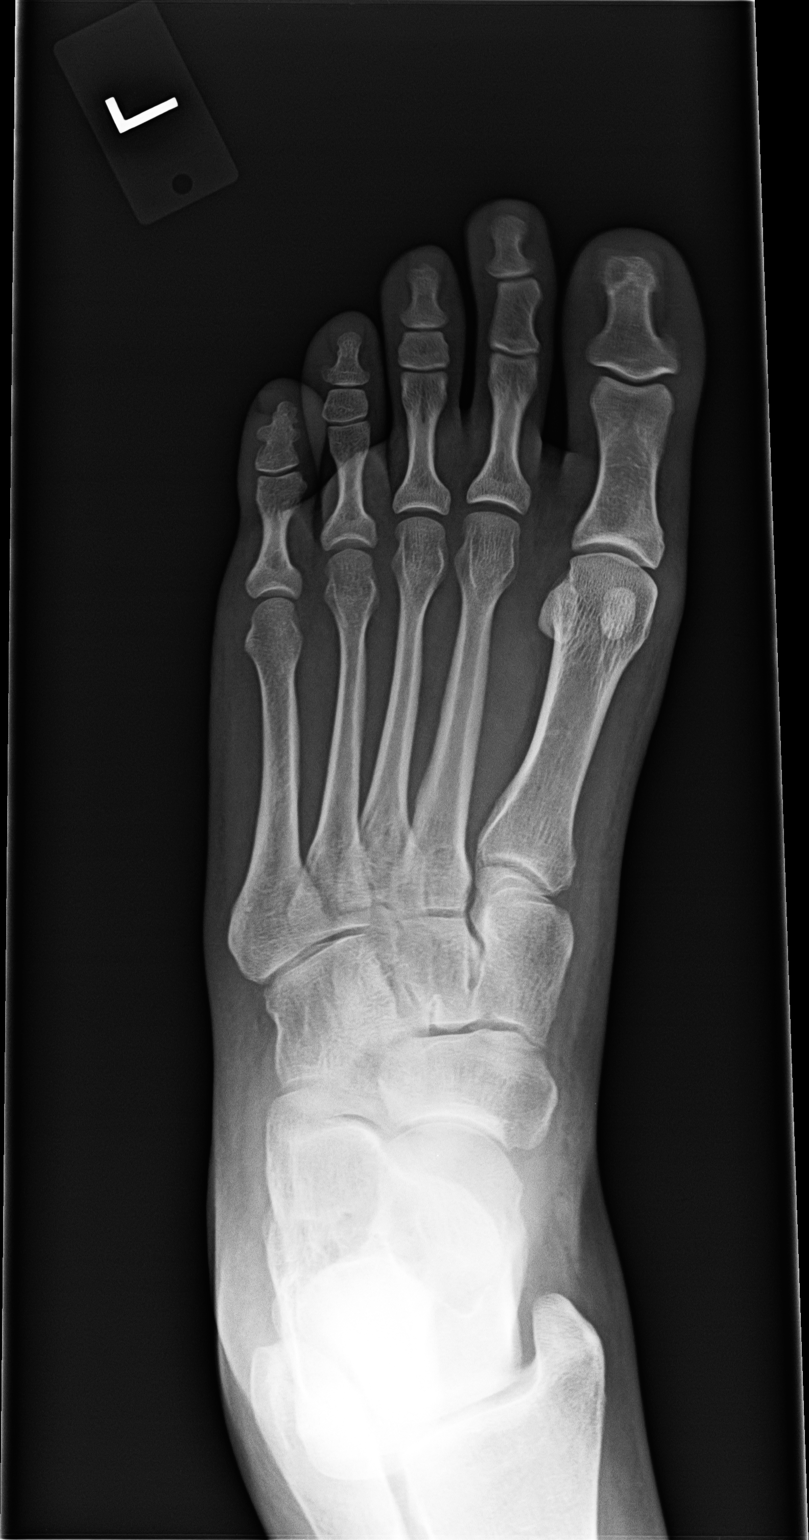

[view not recorded (2 of 3)]
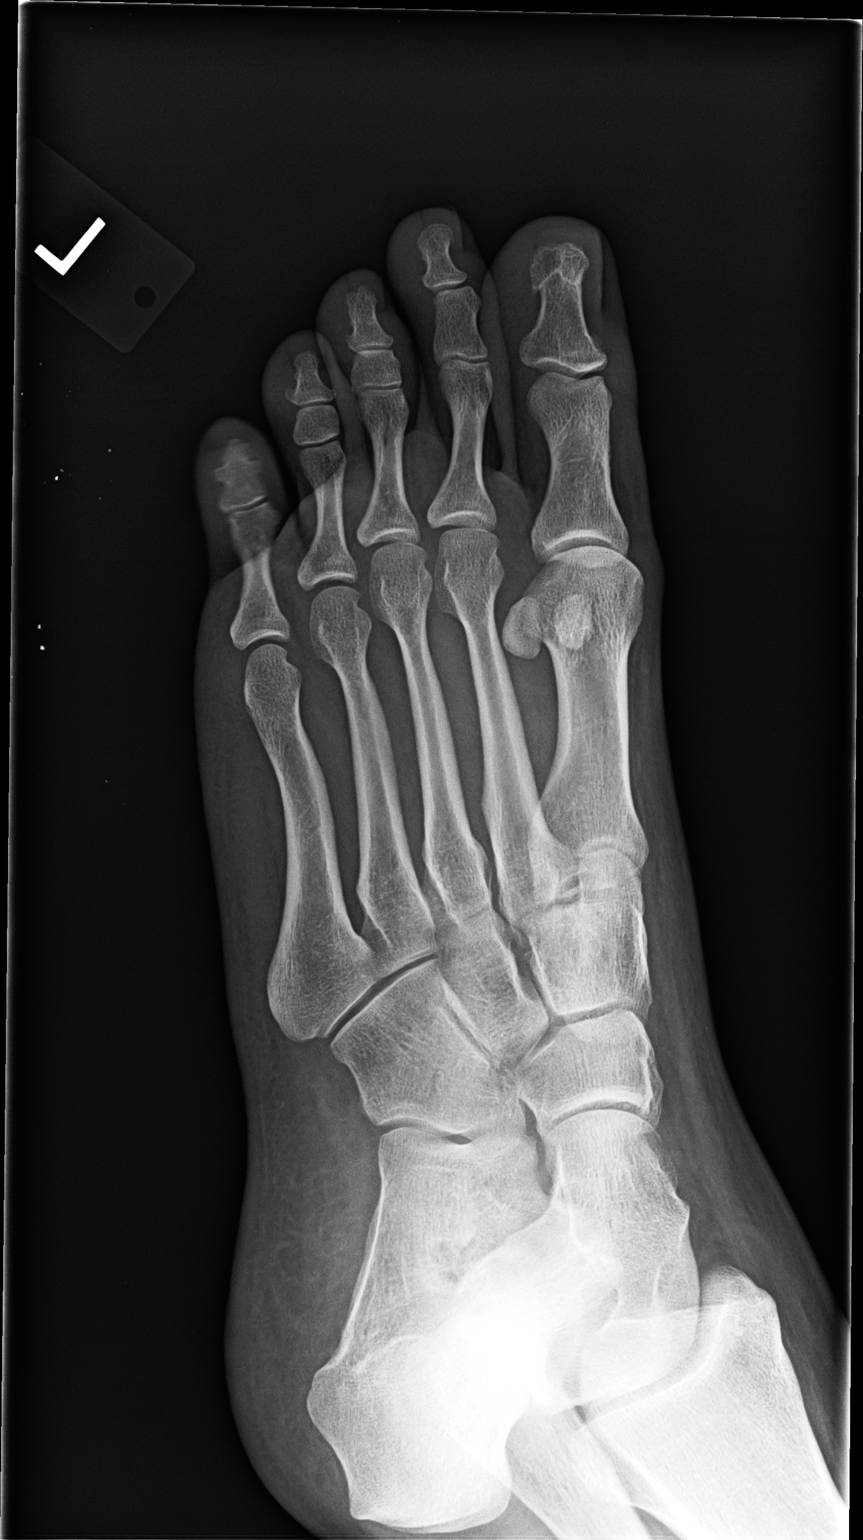

[view not recorded (3 of 3)]
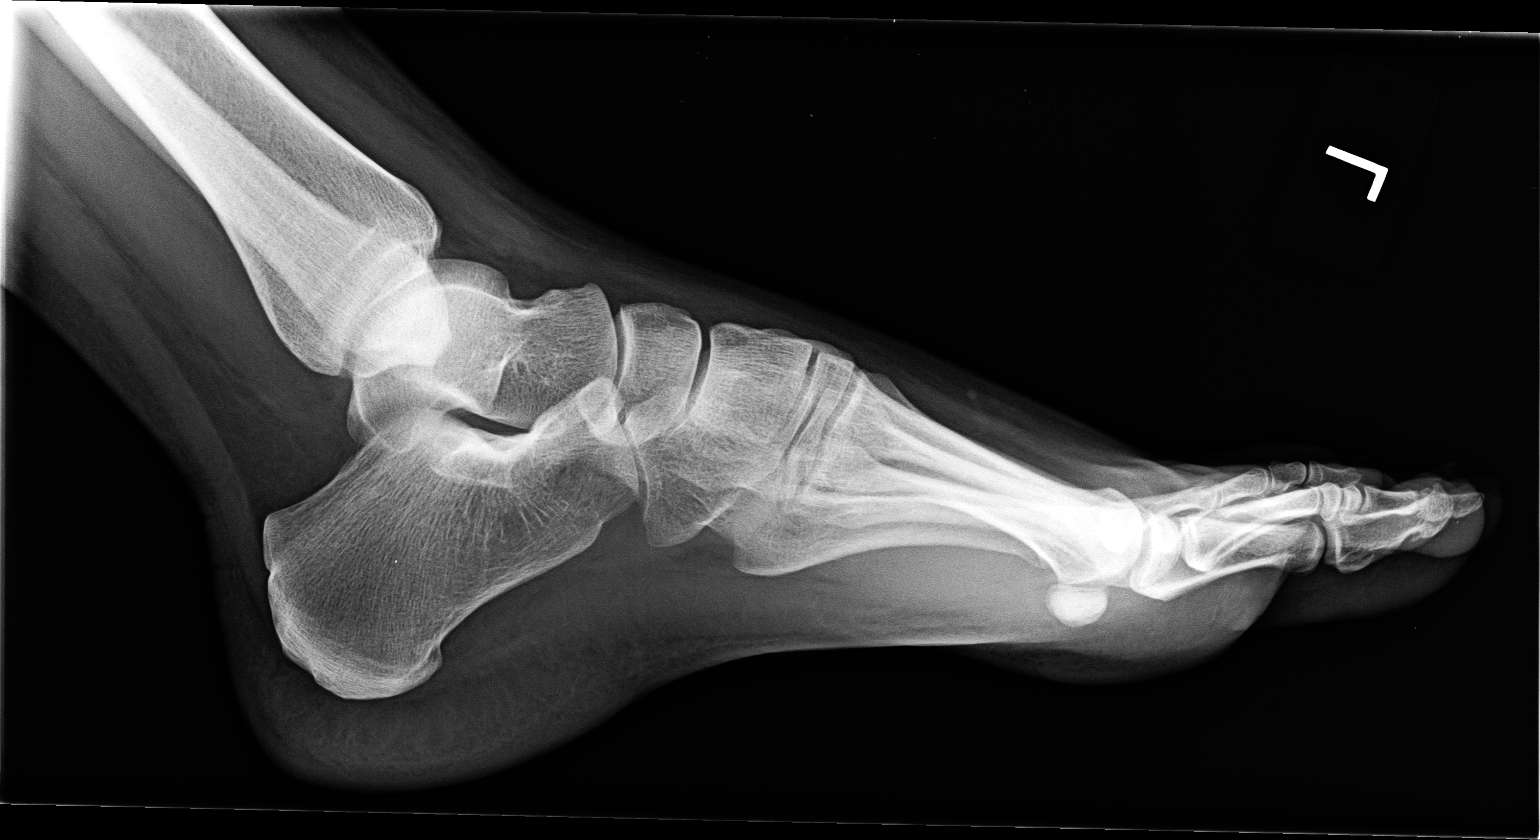

[3 of 3 positions shown; findings below may reference images not displayed]

FINDINGS: There is no evidence of fracture or dislocation. There is no
evidence of arthropathy or other focal bone abnormality. Soft
tissues are unremarkable.
IMPRESSION: No acute abnormality noted.

## 2016-12-02 ENCOUNTER — Ambulatory Visit (INDEPENDENT_AMBULATORY_CARE_PROVIDER_SITE_OTHER): Payer: 59 | Admitting: Family Medicine

## 2016-12-02 ENCOUNTER — Encounter: Payer: Self-pay | Admitting: Family Medicine

## 2016-12-02 VITALS — BP 118/80 | HR 86 | Temp 97.7°F | Ht 66.0 in | Wt 142.1 lb

## 2016-12-02 DIAGNOSIS — R51 Headache: Secondary | ICD-10-CM | POA: Diagnosis not present

## 2016-12-02 DIAGNOSIS — G5792 Unspecified mononeuropathy of left lower limb: Secondary | ICD-10-CM | POA: Diagnosis not present

## 2016-12-02 DIAGNOSIS — G5791 Unspecified mononeuropathy of right lower limb: Secondary | ICD-10-CM | POA: Diagnosis not present

## 2016-12-02 DIAGNOSIS — G5793 Unspecified mononeuropathy of bilateral lower limbs: Secondary | ICD-10-CM

## 2016-12-02 DIAGNOSIS — R519 Headache, unspecified: Secondary | ICD-10-CM

## 2016-12-02 NOTE — Progress Notes (Signed)
BP 118/80   Pulse 86   Temp 97.7 F (36.5 C) (Oral)   Ht 5\' 6"  (1.676 m)   Wt 142 lb 2 oz (64.5 kg)   LMP 11/25/2016 (Approximate)   BMI 22.94 kg/m    Subjective:    Patient ID: Emily Smith, female    DOB: 04-14-1986, 30 y.o.   MRN: [id number]  HPI: Emily Smith is a 30 y.o. female presenting on 12/02/2016 for Headache and Legs hurt (legs have been hurting for over a year)   HPI Headache/frontal Patient has been having a frontal headache is been going on for the past 2 days. She initially used some Tylenol and then she use some no real and she feels like it is getting better today. She denies any fevers or chills or cough or ear pain. She denies any visual problems or issues with lites are sounds. She denies any fevers or chills.  Leg pain/bilateral Patient has 3 spots on both of her legs that cause her to feel significant pain when touched. One spot is just behind her medial malleolus on both legs and the second spot is just distal to her medial knee. The final spot is just proximal to her medial knee bilateral. She has been having this issue for the past year and when it's not touched she has no pain but then when palpated she has significant pain. There are no overlying skin changes or erythema or warmth. She denies any muscle cramping or weakness. She denies any numbness. She denies any fevers or chills or night sweats.  Relevant past medical, surgical, family and social history reviewed and updated as indicated. Interim medical history since our last visit reviewed. Allergies and medications reviewed and updated.  Review of Systems  Constitutional: Negative for chills and fever.  HENT: Negative for congestion, ear discharge and ear pain.   Eyes: Negative for redness and visual disturbance.  Respiratory: Negative for chest tightness and shortness of breath.   Cardiovascular: Negative for chest pain and leg swelling.  Genitourinary: Negative for difficulty urinating and  dysuria.  Musculoskeletal: Positive for myalgias. Negative for back pain and gait problem.  Skin: Negative for color change, rash and wound.  Neurological: Positive for headaches. Negative for light-headedness.  Psychiatric/Behavioral: Negative for agitation and behavioral problems.  All other systems reviewed and are negative.   Per HPI unless specifically indicated above      Objective:    BP 118/80   Pulse 86   Temp 97.7 F (36.5 C) (Oral)   Ht 5\' 6"  (1.676 m)   Wt 142 lb 2 oz (64.5 kg)   LMP 11/25/2016 (Approximate)   BMI 22.94 kg/m   Wt Readings from Last 3 Encounters:  12/02/16 142 lb 2 oz (64.5 kg)  10/27/16 146 lb (66.2 kg)  08/02/16 147 lb 9.6 oz (67 kg)    Physical Exam  Constitutional: She is oriented to person, place, and time. She appears well-developed and well-nourished. No distress.  Eyes: Conjunctivae are normal.  Cardiovascular: Normal rate, regular rhythm, normal heart sounds and intact distal pulses.   No murmur heard. Pulmonary/Chest: Effort normal and breath sounds normal. No respiratory distress. She has no wheezes. She has no rales.  Musculoskeletal: Normal range of motion. She exhibits no edema or tenderness.  Neurological: She is alert and oriented to person, place, and time. She exhibits normal muscle tone. Coordination normal.  Skin: Skin is warm and dry. No rash noted. She is not diaphoretic. No erythema.  Psychiatric: She has a normal mood and affect. Her behavior is normal.  Nursing note and vitals reviewed.     Assessment & Plan:   Problem List Items Addressed This Visit    None    Visit Diagnoses    Neuropathic pain, leg, bilateral    -  Primary   3 Points of tenderness on both legs, been going on for a year, sounds neurologic in origin, no overlying skin changes   Sinus headache       Going on for 2 days, using around, recommended Mucinex and Flonase       Follow up plan: Return if symptoms worsen or fail to  improve.  Counseling provided for all of the vaccine components No orders of the defined types were placed in this encounter.   Caryl Pina, MD [facility] 12/02/2016, 11:10 AM

## 2017-02-04 ENCOUNTER — Other Ambulatory Visit: Payer: Self-pay | Admitting: *Deleted

## 2017-02-04 MED ORDER — OSELTAMIVIR PHOSPHATE 75 MG PO CAPS: 75.0000 mg | ORAL_CAPSULE | Freq: Every day | ORAL | 0 refills | Status: DC

## 2017-02-28 ENCOUNTER — Encounter: Payer: Self-pay | Admitting: Family Medicine

## 2017-02-28 ENCOUNTER — Ambulatory Visit (INDEPENDENT_AMBULATORY_CARE_PROVIDER_SITE_OTHER): Payer: 59 | Admitting: Family Medicine

## 2017-02-28 VITALS — BP 110/72 | HR 73 | Temp 98.1°F | Ht 66.0 in | Wt 143.8 lb

## 2017-02-28 DIAGNOSIS — F901 Attention-deficit hyperactivity disorder, predominantly hyperactive type: Secondary | ICD-10-CM | POA: Diagnosis not present

## 2017-02-28 DIAGNOSIS — E890 Postprocedural hypothyroidism: Secondary | ICD-10-CM | POA: Insufficient documentation

## 2017-02-28 DIAGNOSIS — Z9889 Other specified postprocedural states: Secondary | ICD-10-CM | POA: Insufficient documentation

## 2017-02-28 DIAGNOSIS — E039 Hypothyroidism, unspecified: Secondary | ICD-10-CM | POA: Insufficient documentation

## 2017-02-28 MED ORDER — AMPHETAMINE-DEXTROAMPHETAMINE 20 MG PO TABS: 20.0000 mg | ORAL_TABLET | Freq: Two times a day (BID) | ORAL | 0 refills | Status: DC

## 2017-02-28 MED ORDER — AMPHETAMINE-DEXTROAMPHETAMINE 20 MG PO TABS: 20.0000 mg | ORAL_TABLET | Freq: Two times a day (BID) | ORAL | 0 refills | Status: AC

## 2017-02-28 MED ORDER — ETONOGESTREL-ETHINYL ESTRADIOL 0.12-0.015 MG/24HR VA RING: 1.0000 | VAGINAL_RING | VAGINAL | 3 refills | Status: DC

## 2017-02-28 NOTE — Progress Notes (Signed)
BP 110/72   Pulse 73   Temp 98.1 F (36.7 C) (Oral)   Ht 5\' 6"  (1.676 m)   Wt 143 lb 12.8 oz (65.2 kg)   BMI 23.21 kg/m    Subjective:    Patient ID: Emily Smith, female    DOB: 07-17-1986, 31 y.o.   MRN: [id number]  HPI: Emily Smith is a 31 y.o. female presenting on 02/28/2017 for ADD followup   HPI ADHD recheck Patient is coming in for an ADHD recheck. She is currently on Adderall 20 mg twice daily and tries take some weekends off so she does get used to the medication. She says it does work well for her and gives her focus at work, she struggles with it when she is outside of work. She denies any suicidal ideations or mood swings or thoughts of hurting herself. She is in a stable relationship for the past 12 years.  Relevant past medical, surgical, family and social history reviewed and updated as indicated. Interim medical history since our last visit reviewed. Allergies and medications reviewed and updated.  Review of Systems  Constitutional: Negative for chills, fever and unexpected weight change.  Respiratory: Negative for chest tightness and shortness of breath.   Cardiovascular: Negative for chest pain and leg swelling.  Musculoskeletal: Negative for back pain and gait problem.  Skin: Negative for rash.  Neurological: Negative for light-headedness and headaches.  Psychiatric/Behavioral: Positive for decreased concentration. Negative for agitation, behavioral problems, self-injury, sleep disturbance and suicidal ideas. The patient is hyperactive. The patient is not nervous/anxious.   All other systems reviewed and are negative.   Per HPI unless specifically indicated above     Objective:    BP 110/72   Pulse 73   Temp 98.1 F (36.7 C) (Oral)   Ht 5\' 6"  (1.676 m)   Wt 143 lb 12.8 oz (65.2 kg)   BMI 23.21 kg/m   Wt Readings from Last 3 Encounters:  02/28/17 143 lb 12.8 oz (65.2 kg)  12/02/16 142 lb 2 oz (64.5 kg)  10/27/16 146 lb (66.2 kg)    Physical  Exam  Constitutional: She is oriented to person, place, and time. She appears well-developed and well-nourished. No distress.  Eyes: Conjunctivae are normal.  Neck: Neck supple. No thyromegaly present.  Cardiovascular: Normal rate, regular rhythm, normal heart sounds and intact distal pulses.   No murmur heard. Pulmonary/Chest: Effort normal and breath sounds normal. No respiratory distress. She has no wheezes.  Musculoskeletal: Normal range of motion. She exhibits no edema or tenderness.  Lymphadenopathy:    She has no cervical adenopathy.  Neurological: She is alert and oriented to person, place, and time. Coordination normal.  Skin: Skin is warm and dry. No rash noted. She is not diaphoretic.  Psychiatric: She has a normal mood and affect. Judgment and thought content normal. She is not hyperactive. She expresses no suicidal ideation. She expresses no suicidal plans. She is inattentive.  Nursing note and vitals reviewed.     Assessment & Plan:   Problem List Items Addressed This Visit      Other   ADHD (attention deficit hyperactivity disorder) - Primary   Relevant Medications   amphetamine-dextroamphetamine (ADDERALL) 20 MG tablet   amphetamine-dextroamphetamine (ADDERALL) 20 MG tablet   amphetamine-dextroamphetamine (ADDERALL) 20 MG tablet       Follow up plan: Return in about 3 months (around 05/31/2017), or if symptoms worsen or fail to improve, for ADHD recheck.  Counseling provided for all  of the vaccine components No orders of the defined types were placed in this encounter.   Caryl Pina, MD [facility] 02/28/2017, 8:47 AM

## 2017-03-31 ENCOUNTER — Encounter: Payer: Self-pay | Admitting: Nurse Practitioner

## 2017-03-31 ENCOUNTER — Ambulatory Visit (INDEPENDENT_AMBULATORY_CARE_PROVIDER_SITE_OTHER): Payer: 59 | Admitting: Nurse Practitioner

## 2017-03-31 VITALS — BP 112/74 | HR 76 | Temp 97.1°F | Ht 66.0 in | Wt 145.0 lb

## 2017-03-31 DIAGNOSIS — Z3201 Encounter for pregnancy test, result positive: Secondary | ICD-10-CM

## 2017-03-31 MED ORDER — PRENATAL VITAMINS 0.8 MG PO TABS: 1.0000 | ORAL_TABLET | Freq: Every day | ORAL | 0 refills | Status: AC

## 2017-03-31 NOTE — Progress Notes (Signed)
   Subjective:    Patient ID: Emily Smith, female    DOB: March 17, 1986, 31 y.o.   MRN: [id number]  HPI Patient comes in today stating that her last period was February 02, 2017. SHe is very irregular anyway. She has been using hr nuvaring. Today she tested positive for pregnancy.    Review of Systems  Constitutional: Negative.   HENT: Negative.   Respiratory: Negative.   Cardiovascular: Negative.   Musculoskeletal: Negative.   Neurological: Negative.   Psychiatric/Behavioral: Negative.   All other systems reviewed and are negative.      Objective:   Physical Exam  Constitutional: She is oriented to person, place, and time. She appears well-developed and well-nourished. No distress.  Cardiovascular: Normal rate and regular rhythm.   Pulmonary/Chest: Effort normal and breath sounds normal.  Neurological: She is alert and oriented to person, place, and time.  Skin: Skin is warm.  Psychiatric: She has a normal mood and affect. Her behavior is normal. Judgment and thought content normal.   BP 112/74   Pulse 76   Temp 97.1 F (36.2 C) (Oral)   Ht 5\' 6"  (1.676 m)   Wt 145 lb (65.8 kg)   BMI 23.40 kg/m        Assessment & Plan:   1. Positive pregnancy test    Est- 4-6 weeks Stop adderall and nuva ring Check with endocrinlogist about levothyroxine RTO prn  Meds ordered this encounter  Medications  . Prenatal Multivit-Min-Fe-FA (PRENATAL VITAMINS) 0.8 MG tablet    Sig: Take 1 tablet by mouth daily.    Dispense:  30 tablet    Refill:  0    Order Specific Question:   Supervising Provider    Answer:   Eustaquio Maize [[id number]]    Mary-Margaret Hassell Done, FNP

## 2017-10-04 ENCOUNTER — Inpatient Hospital Stay (HOSPITAL_COMMUNITY): Payer: 59

## 2017-10-04 ENCOUNTER — Encounter (HOSPITAL_COMMUNITY)
Admission: AD | Disposition: A | Discharge status: Home / Self Care | Payer: Self-pay | Source: Ambulatory Visit | Attending: Obstetrics and Gynecology

## 2017-10-04 ENCOUNTER — Inpatient Hospital Stay (HOSPITAL_COMMUNITY): Payer: 59 | Admitting: Anesthesiology

## 2017-10-04 ENCOUNTER — Inpatient Hospital Stay (HOSPITAL_COMMUNITY)
Admission: AD | Admit: 2017-10-04 | Discharge: 2017-10-08 | DRG: 786 | Disposition: A | Discharge status: Home / Self Care | Payer: 59 | Source: Ambulatory Visit | Attending: Obstetrics and Gynecology | Admitting: Obstetrics and Gynecology

## 2017-10-04 ENCOUNTER — Encounter (HOSPITAL_COMMUNITY): Payer: Self-pay | Admitting: *Deleted

## 2017-10-04 DIAGNOSIS — D649 Anemia, unspecified: Secondary | ICD-10-CM | POA: Diagnosis present

## 2017-10-04 DIAGNOSIS — O99284 Endocrine, nutritional and metabolic diseases complicating childbirth: Secondary | ICD-10-CM | POA: Diagnosis present

## 2017-10-04 DIAGNOSIS — O9902 Anemia complicating childbirth: Secondary | ICD-10-CM | POA: Diagnosis present

## 2017-10-04 DIAGNOSIS — Z85850 Personal history of malignant neoplasm of thyroid: Secondary | ICD-10-CM | POA: Diagnosis not present

## 2017-10-04 DIAGNOSIS — Q5128 Other doubling of uterus, other specified: Secondary | ICD-10-CM

## 2017-10-04 DIAGNOSIS — O42913 Preterm premature rupture of membranes, unspecified as to length of time between rupture and onset of labor, third trimester: Secondary | ICD-10-CM | POA: Diagnosis present

## 2017-10-04 DIAGNOSIS — Z3A34 34 weeks gestation of pregnancy: Secondary | ICD-10-CM | POA: Diagnosis not present

## 2017-10-04 DIAGNOSIS — O3403 Maternal care for unspecified congenital malformation of uterus, third trimester: Secondary | ICD-10-CM | POA: Diagnosis present

## 2017-10-04 DIAGNOSIS — Q512 Other doubling of uterus, unspecified: Secondary | ICD-10-CM | POA: Diagnosis not present

## 2017-10-04 DIAGNOSIS — Z87891 Personal history of nicotine dependence: Secondary | ICD-10-CM | POA: Diagnosis not present

## 2017-10-04 DIAGNOSIS — O321XX0 Maternal care for breech presentation, not applicable or unspecified: Secondary | ICD-10-CM | POA: Diagnosis present

## 2017-10-04 DIAGNOSIS — Z98891 History of uterine scar from previous surgery: Secondary | ICD-10-CM

## 2017-10-04 DIAGNOSIS — O42919 Preterm premature rupture of membranes, unspecified as to length of time between rupture and onset of labor, unspecified trimester: Secondary | ICD-10-CM | POA: Diagnosis present

## 2017-10-04 DIAGNOSIS — E039 Hypothyroidism, unspecified: Secondary | ICD-10-CM | POA: Diagnosis present

## 2017-10-04 LAB — CBC
HEMATOCRIT: 34 % — AB (ref 36.0–46.0)
HEMOGLOBIN: 11.6 g/dL — AB (ref 12.0–15.0)
MCH: 30.2 pg (ref 26.0–34.0)
MCHC: 34.1 g/dL (ref 30.0–36.0)
MCV: 88.5 fL (ref 78.0–100.0)
Platelets: 233 10*3/uL (ref 150–400)
RBC: 3.84 MIL/uL — ABNORMAL LOW (ref 3.87–5.11)
RDW: 13 % (ref 11.5–15.5)
WBC: 9.2 10*3/uL (ref 4.0–10.5)

## 2017-10-04 LAB — POCT FERN TEST: POCT Fern Test: POSITIVE

## 2017-10-04 LAB — TYPE AND SCREEN
ABO/RH(D): O POS
ANTIBODY SCREEN: NEGATIVE

## 2017-10-04 SURGERY — Surgical Case
Anesthesia: Spinal

## 2017-10-04 MED ORDER — OXYTOCIN 10 UNIT/ML IJ SOLN
INTRAMUSCULAR | Status: AC
  Filled 2017-10-04: qty 4

## 2017-10-04 MED ORDER — SCOPOLAMINE 1 MG/3DAYS TD PT72
MEDICATED_PATCH | TRANSDERMAL | Status: AC
  Filled 2017-10-04: qty 1

## 2017-10-04 MED ORDER — SCOPOLAMINE 1 MG/3DAYS TD PT72
MEDICATED_PATCH | TRANSDERMAL | Status: DC | PRN
  Administered 2017-10-04: 1 via TRANSDERMAL

## 2017-10-04 MED ORDER — ONDANSETRON HCL 4 MG/2ML IJ SOLN
INTRAMUSCULAR | Status: AC
  Filled 2017-10-04: qty 2

## 2017-10-04 MED ORDER — FENTANYL CITRATE (PF) 100 MCG/2ML IJ SOLN
INTRAMUSCULAR | Status: DC | PRN
  Administered 2017-10-04: 10 ug via INTRATHECAL

## 2017-10-04 MED ORDER — DEXAMETHASONE SODIUM PHOSPHATE 10 MG/ML IJ SOLN
INTRAMUSCULAR | Status: DC | PRN
  Administered 2017-10-04: 10 mg via INTRAVENOUS

## 2017-10-04 MED ORDER — SODIUM CHLORIDE 0.9 % IR SOLN
Status: DC | PRN
  Administered 2017-10-04 (×2): 1

## 2017-10-04 MED ORDER — MORPHINE SULFATE (PF) 0.5 MG/ML IJ SOLN
INTRAMUSCULAR | Status: AC
  Filled 2017-10-04: qty 10

## 2017-10-04 MED ORDER — CEFAZOLIN SODIUM-DEXTROSE 2-4 GM/100ML-% IV SOLN
INTRAVENOUS | Status: AC
  Filled 2017-10-04: qty 100

## 2017-10-04 MED ORDER — BUPIVACAINE IN DEXTROSE 0.75-8.25 % IT SOLN
INTRATHECAL | Status: AC
  Filled 2017-10-04: qty 2

## 2017-10-04 MED ORDER — DEXAMETHASONE SODIUM PHOSPHATE 4 MG/ML IJ SOLN
INTRAMUSCULAR | Status: AC
  Filled 2017-10-04: qty 1

## 2017-10-04 MED ORDER — CEFAZOLIN SODIUM-DEXTROSE 2-3 GM-% IV SOLR
INTRAVENOUS | Status: DC | PRN
  Administered 2017-10-04: 2 g via INTRAVENOUS

## 2017-10-04 MED ORDER — BETAMETHASONE SOD PHOS & ACET 6 (3-3) MG/ML IJ SUSP
12.0000 mg | Freq: Once | INTRAMUSCULAR | Status: AC
  Administered 2017-10-04: 12 mg via INTRAMUSCULAR
  Filled 2017-10-04: qty 2

## 2017-10-04 MED ORDER — LACTATED RINGERS IV SOLN
INTRAVENOUS | Status: DC | PRN
  Administered 2017-10-04: 23:00:00 via INTRAVENOUS

## 2017-10-04 MED ORDER — PHENYLEPHRINE 8 MG IN D5W 100 ML (0.08MG/ML) PREMIX OPTIME
INJECTION | INTRAVENOUS | Status: AC
Start: 2017-10-04 — End: 2017-10-04
  Filled 2017-10-04: qty 100

## 2017-10-04 MED ORDER — SOD CITRATE-CITRIC ACID 500-334 MG/5ML PO SOLN
30.0000 mL | Freq: Once | ORAL | Status: AC
Start: 2017-10-04 — End: 2017-10-04
  Administered 2017-10-04: 30 mL via ORAL

## 2017-10-04 MED ORDER — MEPERIDINE HCL 25 MG/ML IJ SOLN
INTRAMUSCULAR | Status: DC | PRN
  Administered 2017-10-04 (×2): 12.5 mg via INTRAVENOUS

## 2017-10-04 MED ORDER — LACTATED RINGERS IV SOLN
INTRAVENOUS | Status: DC
  Administered 2017-10-04 (×3): via INTRAVENOUS

## 2017-10-04 MED ORDER — SOD CITRATE-CITRIC ACID 500-334 MG/5ML PO SOLN
ORAL | Status: AC
  Administered 2017-10-04: 30 mL via ORAL
  Filled 2017-10-04: qty 15

## 2017-10-04 MED ORDER — ONDANSETRON HCL 4 MG/2ML IJ SOLN
INTRAMUSCULAR | Status: DC | PRN
  Administered 2017-10-04: 4 mg via INTRAVENOUS

## 2017-10-04 MED ORDER — FAMOTIDINE IN NACL 20-0.9 MG/50ML-% IV SOLN
20.0000 mg | Freq: Once | INTRAVENOUS | Status: AC
  Administered 2017-10-04: 20 mg via INTRAVENOUS

## 2017-10-04 MED ORDER — MORPHINE SULFATE (PF) 0.5 MG/ML IJ SOLN
INTRAMUSCULAR | Status: DC | PRN
  Administered 2017-10-04: .2 mg via INTRATHECAL

## 2017-10-04 MED ORDER — BUPIVACAINE IN DEXTROSE 0.75-8.25 % IT SOLN
INTRATHECAL | Status: DC | PRN
  Administered 2017-10-04: 1.6 mL via INTRATHECAL

## 2017-10-04 MED ORDER — FAMOTIDINE IN NACL 20-0.9 MG/50ML-% IV SOLN
INTRAVENOUS | Status: AC
  Administered 2017-10-04: 20 mg via INTRAVENOUS
  Filled 2017-10-04: qty 50

## 2017-10-04 MED ORDER — PHENYLEPHRINE 8 MG IN D5W 100 ML (0.08MG/ML) PREMIX OPTIME
INJECTION | INTRAVENOUS | Status: DC | PRN
  Administered 2017-10-04: 60 ug/min via INTRAVENOUS

## 2017-10-04 MED ORDER — MEPERIDINE HCL 25 MG/ML IJ SOLN
INTRAMUSCULAR | Status: AC
  Filled 2017-10-04: qty 1

## 2017-10-04 MED ORDER — FENTANYL CITRATE (PF) 100 MCG/2ML IJ SOLN
INTRAMUSCULAR | Status: AC
  Filled 2017-10-04: qty 2

## 2017-10-04 MED ORDER — LACTATED RINGERS IV SOLN
INTRAVENOUS | Status: DC | PRN
  Administered 2017-10-04: 40 [IU] via INTRAVENOUS

## 2017-10-04 SURGICAL SUPPLY — 35 items
BENZOIN TINCTURE PRP APPL 2/3 (GAUZE/BANDAGES/DRESSINGS) ×2 IMPLANT
CHLORAPREP W/TINT 26ML (MISCELLANEOUS) ×2 IMPLANT
CLAMP CORD UMBIL (MISCELLANEOUS) IMPLANT
CLOSURE STERI STRIP 1/2 X4 (GAUZE/BANDAGES/DRESSINGS) ×2 IMPLANT
CLOTH BEACON ORANGE TIMEOUT ST (SAFETY) ×2 IMPLANT
CONTAINER PREFILL 10% NBF 15ML (MISCELLANEOUS) IMPLANT
DRSG OPSITE POSTOP 4X10 (GAUZE/BANDAGES/DRESSINGS) ×2 IMPLANT
ELECT REM PT RETURN 9FT ADLT (ELECTROSURGICAL) ×2
ELECTRODE REM PT RTRN 9FT ADLT (ELECTROSURGICAL) ×1 IMPLANT
EXTRACTOR VACUUM KIWI (MISCELLANEOUS) IMPLANT
EXTRACTOR VACUUM M CUP 4 TUBE (SUCTIONS) IMPLANT
GLOVE BIOGEL PI IND STRL 7.0 (GLOVE) ×1 IMPLANT
GLOVE BIOGEL PI INDICATOR 7.0 (GLOVE) ×1
GLOVE ORTHO TXT STRL SZ7.5 (GLOVE) ×2 IMPLANT
GOWN STRL REUS W/TWL LRG LVL3 (GOWN DISPOSABLE) ×4 IMPLANT
KIT ABG SYR 3ML LUER SLIP (SYRINGE) IMPLANT
NEEDLE HYPO 25X5/8 SAFETYGLIDE (NEEDLE) ×2 IMPLANT
NS IRRIG 1000ML POUR BTL (IV SOLUTION) ×2 IMPLANT
PACK C SECTION WH (CUSTOM PROCEDURE TRAY) ×2 IMPLANT
PAD OB MATERNITY 4.3X12.25 (PERSONAL CARE ITEMS) ×4 IMPLANT
PENCIL SMOKE EVAC W/HOLSTER (ELECTROSURGICAL) ×2 IMPLANT
RTRCTR C-SECT PINK 25CM LRG (MISCELLANEOUS) ×2 IMPLANT
SUT CHROMIC 1 CTX 36 (SUTURE) ×4 IMPLANT
SUT PLAIN 0 NONE (SUTURE) IMPLANT
SUT PLAIN 2 0 (SUTURE) ×1
SUT PLAIN 2 0 XLH (SUTURE) IMPLANT
SUT PLAIN ABS 2-0 CT1 27XMFL (SUTURE) ×1 IMPLANT
SUT VIC AB 0 CT1 27 (SUTURE) ×2
SUT VIC AB 0 CT1 27XBRD ANBCTR (SUTURE) ×2 IMPLANT
SUT VIC AB 2-0 CT1 (SUTURE) ×2 IMPLANT
SUT VIC AB 2-0 CT1 27 (SUTURE) ×1
SUT VIC AB 2-0 CT1 TAPERPNT 27 (SUTURE) ×1 IMPLANT
SUT VIC AB 4-0 KS 27 (SUTURE) ×2 IMPLANT
TOWEL OR 17X24 6PK STRL BLUE (TOWEL DISPOSABLE) ×2 IMPLANT
TRAY FOLEY BAG SILVER LF 14FR (SET/KITS/TRAYS/PACK) ×2 IMPLANT

## 2017-10-04 NOTE — Transfer of Care (Signed)
Immediate Anesthesia Transfer of Care Note  Patient: Emily Smith  Procedure(s) Performed: CESAREAN SECTION (N/A )  Patient Location: PACU  Anesthesia Type:Spinal  Level of Consciousness: awake, alert  and oriented  Airway & Oxygen Therapy: Patient Spontanous Breathing  Post-op Assessment: Report given to RN and Post -op Vital signs reviewed and stable  Post vital signs: Reviewed and stable  Last Vitals:  Vitals:   10/04/17 1822 10/04/17 2143  BP: 120/73 124/85  Pulse: 78 73  Resp: 18 16  Temp: 36.8 C 37.1 C  SpO2: 99%     Last Pain:  Vitals:   10/04/17 2143  TempSrc: Oral  PainSc:          Complications: No apparent anesthesia complications

## 2017-10-04 NOTE — MAU Provider Note (Signed)
History     CSN: [id number]  Arrival date and time: 10/04/17 1811  First Provider Initiated Contact with Patient 10/04/17 1852      Chief Complaint  Patient presents with  . Rupture of Membranes   HPI Emily Smith is a 31 y.o. G2P1001 at [redacted]w[redacted]d who presents with LOF. Reports large gush of fluid around 1700 today that soaked through her pants. Feels like leaking has continued. Appears clear with a small amount of bloody mucous mixed in when she used the restroom in MAU. Denies abdominal pain. Positive fetal movement. No fever/chills. Denies complications with this pregnancy. Previous baby was TSVD.    OB History    Gravida Para Term Preterm AB Living   2 1 1     1    SAB TAB Ectopic Multiple Live Births                  Past Medical History:  Diagnosis Date  . Hoarseness    began 03/11/2012; associated w/ mild sore throat  . Mood changes    takes zoloft  . Seizures ([place])    as teenager ; none since  . Squamous cell skin cancer, thigh 2012   left  . Thyroid cancer ([place]) 2013    Past Surgical History:  Procedure Laterality Date  . NODE DISSECTION  03/22/2012   Procedure: NODE DISSECTION;  Surgeon: Izora Gala, MD;  Location: [facility];  Service: ENT;  Laterality: N/A;  Central Node Dissection  . throidectomy  03/22/2012  . THYROIDECTOMY  03/22/2012   Procedure: THYROIDECTOMY;  Surgeon: Izora Gala, MD;  Location: [facility] OR;  Service: ENT;  Laterality: Bilateral;  Total Thyroidectomy    Family History  Problem Relation Age of Onset  . Thyroid cancer Mother   . Lung cancer Paternal Grandfather   . Anesthesia problems Neg Hx     Social History  Substance Use Topics  . Smoking status: Former Smoker    Years: 5.00    Types: Cigarettes    Quit date: 12/27/2010  . Smokeless tobacco: Never Used     Comment: Socially   . Alcohol use 0.6 oz/week    1 Glasses of wine per week     Comment: occasional; not while preg    Allergies:  Allergies  Allergen Reactions  . Sulfa  Antibiotics Rash    Severe rash    Prescriptions Prior to Admission  Medication Sig Dispense Refill Last Dose  . amphetamine-dextroamphetamine (ADDERALL) 20 MG tablet Take 1 tablet (20 mg total) by mouth 2 (two) times daily. 60 tablet 0 Taking  . amphetamine-dextroamphetamine (ADDERALL) 20 MG tablet Take 1 tablet (20 mg total) by mouth 2 (two) times daily. 60 tablet 0 Taking  . amphetamine-dextroamphetamine (ADDERALL) 20 MG tablet Take 1 tablet (20 mg total) by mouth 2 (two) times daily. 60 tablet 0 Taking  . etonogestrel-ethinyl estradiol (NUVARING) 0.12-0.015 MG/24HR vaginal ring Place 1 each vaginally every 28 (twenty-eight) days. Insert vaginally and leave in place for 3 consecutive weeks, then remove for 1 week. (Patient not taking: Reported on 03/31/2017) 3 each 3 Not Taking  . levothyroxine (SYNTHROID, LEVOTHROID) 200 MCG tablet Take 1 tablet (200 mcg total) by mouth daily before breakfast. (Patient taking differently: Take 175 mcg by mouth daily before breakfast. ) 30 tablet 0 Taking  . Prenatal Multivit-Min-Fe-FA (PRENATAL VITAMINS) 0.8 MG tablet Take 1 tablet by mouth daily. 30 tablet 0     Review of Systems  Constitutional: Negative.   Gastrointestinal: Negative.  Genitourinary: Positive for vaginal discharge.   Physical Exam   Blood pressure 120/73, pulse 78, temperature 98.3 F (36.8 C), temperature source Oral, resp. rate 18, last menstrual period 11/25/2016, SpO2 99 %.  Physical Exam  Nursing note and vitals reviewed. Constitutional: She is oriented to person, place, and time. She appears well-developed and well-nourished. No distress.  HENT:  Head: Normocephalic and atraumatic.  Eyes: Conjunctivae are normal. Right eye exhibits no discharge. Left eye exhibits no discharge. No scleral icterus.  Neck: Normal range of motion.  Respiratory: Effort normal. No respiratory distress.  GI: Soft. There is no tenderness.  Genitourinary:  Genitourinary Comments: Pooling moderate  amount of clear fluid; no blood. Cervix visually closed.   Neurological: She is alert and oriented to person, place, and time.  Skin: Skin is warm and dry. She is not diaphoretic.  Psychiatric: She has a normal mood and affect. Her behavior is normal. Judgment and thought content normal.   Fetal Tracing:  Baseline: 135 Variability: moderate Accelerations: 15x15 Decelerations: none  Toco: irr ctx MAU Course  Procedures Results for orders placed or performed during the hospital encounter of 10/04/17 (from the past 24 hour(s))  Fern Test     Status: None   Collection Time: 10/04/17  6:44 PM  Result Value Ref Range   POCT Fern Test Positive = ruptured amniotic membanes     MDM Reactive NST + pooling, + fern, cervix visually closed Informal bedside ultrasound performed by Dr. Gerarda Fraction, fetus in transverse position, head maternal left.  GBS collected BMZ given C/w Dr. Willis Modena. Will obtain formal ultrasound for presentation.   Care turned over to [facility] CNM. Formal ultrasound in process   Jorje Guild, NP 10/04/2017 7:53 PM   Assessment and Plan  US showed that breech is presenting AFI 12.7 Dr Willis Modena updated and will come to talk with her Seabron Spates, CNM

## 2017-10-04 NOTE — Anesthesia Preprocedure Evaluation (Signed)
Anesthesia Evaluation  Patient identified by MRN, date of birth, ID band Patient awake    Reviewed: Allergy & Precautions, H&P , NPO status , Patient's Chart, lab work & pertinent test results  Airway Mallampati: II   Neck ROM: full    Dental   Pulmonary former smoker,    Pulmonary exam normal        Cardiovascular Normal cardiovascular exam     Neuro/Psych Seizures -,  PSYCHIATRIC DISORDERS ADHD (attention deficit hyperactivity disorder)   GI/Hepatic   Endo/Other  Hypothyroidism   Renal/GU      Musculoskeletal   Abdominal   Peds  Hematology  (+) anemia ,   Anesthesia Other Findings   Reproductive/Obstetrics (+) Pregnancy Primary Cesarean section for breech presentation                              Anesthesia Physical  Anesthesia Plan  ASA: III  Anesthesia Plan: Spinal   Post-op Pain Management:    Induction: Intravenous  PONV Risk Score and Plan: 2 and Ondansetron, Dexamethasone and Treatment may vary due to age or medical condition  Airway Management Planned: Natural Airway  Additional Equipment:   Intra-op Plan:   Post-operative Plan:   Informed Consent: I have reviewed the patients History and Physical, chart, labs and discussed the procedure including the risks, benefits and alternatives for the proposed anesthesia with the patient or authorized representative who has indicated his/her understanding and acceptance.   Dental advisory given  Plan Discussed with: CRNA  Anesthesia Plan Comments:         Anesthesia Quick Evaluation

## 2017-10-04 NOTE — Op Note (Signed)
Preoperative diagnosis: Intrauterine pregnancy at 34+ weeks, PPROM, breech presentation Postoperative diagnosis: Same, septate uterus Procedure: Primary low transverse cesarean section without extensions Surgeon: Cheri Fowler M.D. Anesthesia: Spinal  Findings: Patient had normal gravid anatomy and delivered a viable female infant with Apgars of 9 and 9 weight 4 lbs 11 oz.  She had a septate uterus with the pregnancy on the left side of the septum.  Baby was in the frank breech presentation Estimated blood loss: 1200 cc Specimens: Placenta sent for routine pathology Complications: None  Procedure in detail: The patient was taken to the operating room and placed in the sitting position. The anesthesiologist instilled spinal anesthesia.  She was then placed in the dorsosupine position with left tilt. Abdomen was then prepped and draped in the usual sterile fashion, and a foley catheter was inserted. The level of her anesthesia was found to be adequate. Abdomen was entered via a standard Pfannenstiel incision. Once the peritoneal cavity was entered the Alexis disposable self-retaining retractor was placed and good visualization was achieved. A 4 cm transverse incision was then made in the lower uterine segment pushing the bladder inferior. Once the uterine cavity was entered the incision was extended digitally. The fetal breech was grasped and the breech, legs, torso and arms delivered easily.  The vertex then came fairly easily.  Nuchal cord x 2 reduced.. Cord was doubly clamped and cut and the infant handed to the awaiting pediatric team after one minute. Cord blood was obtained. The placenta delivered spontaneously. Uterus was wiped dry with clean lap pad and all clots and debris were removed. Uterine incision was inspected and found to be free of extensions. Palpation revealed a septate uterus.  Uterine incision was closed in 2 layers with running locking #1 Chromic for the first layer and a second  imbricating layer also with #1 chromic. Tubes and ovaries were inspected and found to be normal. Uterine incision was inspected and found to be hemostatic. Bleeding from serosal edges was controlled with electrocautery. The Alexis retractor was removed. Subfascial space was irrigated and made hemostatic with electrocautery. Peritoneum was closed with 2-0 Vicryl.  Fascia was closed in running fashion starting at both ends and meeting in the middle with 0 Vicryl. Subcutaneous tissue was then irrigated and made hemostatic with electrocautery, then closed with running 2-0 plain gut. Skin was closed with running 4-0 Vicryl subcuticular suture followed by steri-strips and a sterile dressing. Patient tolerated the procedure well and was taken to the recovery in stable condition. Counts were correct x2, she received Ancef 2 g IV at the beginning of the procedure and she had PAS hose on throughout the procedure.

## 2017-10-04 NOTE — H&P (Signed)
Emily Smith is a 31 y.o. female, G2 P1001, EGA 34+ with EDC 11-17 presenting for leaking fluid.  Eval in MAU confirms PPROM, breech by u/s.  Prenatal care essentially uncomplicated until now.  Followed by Dr. Chalmers Cater for h/o thyroid cancer with thyroidectomy.  OB History    Gravida Para Term Preterm AB Living   2 1 1     1    SAB TAB Ectopic Multiple Live Births                 Past Medical History:  Diagnosis Date  . Hoarseness    began 03/11/2012; associated w/ mild sore throat  . Mood changes    takes zoloft  . Seizures ([place])    as teenager ; none since  . Squamous cell skin cancer, thigh 2012   left  . Thyroid cancer ([place]) 2013   Past Surgical History:  Procedure Laterality Date  . NODE DISSECTION  03/22/2012   Procedure: NODE DISSECTION;  Surgeon: Izora Gala, MD;  Location: [facility];  Service: ENT;  Laterality: N/A;  Central Node Dissection  . throidectomy  03/22/2012  . THYROIDECTOMY  03/22/2012   Procedure: THYROIDECTOMY;  Surgeon: Izora Gala, MD;  Location: [facility];  Service: ENT;  Laterality: Bilateral;  Total Thyroidectomy   Family History: family history includes Lung cancer in her paternal grandfather; Thyroid cancer in her mother. Social History:  reports that she quit smoking about 6 years ago. Her smoking use included Cigarettes. She quit after 5.00 years of use. She has never used smokeless tobacco. She reports that she drinks about 0.6 oz of alcohol per week . She reports that she does not use drugs.     Maternal Diabetes: No Genetic Screening: Declined Maternal Ultrasounds/Referrals: Normal Fetal Ultrasounds or other Referrals:  None Maternal Substance Abuse:  No Significant Maternal Medications:  None Significant Maternal Lab Results:  None Other Comments:  None  Review of Systems  Respiratory: Negative.   Cardiovascular: Negative.    Maternal Medical History:  Reason for admission: Rupture of membranes.   Contractions: Frequency: irregular.   Perceived  severity is mild.    Fetal activity: Perceived fetal activity is normal.    Prenatal complications: no prenatal complications Prenatal Complications - Diabetes: none.      Blood pressure 124/85, pulse 73, temperature 98.8 F (37.1 C), temperature source Oral, resp. rate 16, height 5\' 6"  (1.676 m), weight 159 lb (72.1 kg), last menstrual period 11/25/2016, SpO2 99 %. Maternal Exam:  Uterine Assessment: Contraction strength is mild.  Contraction frequency is irregular.   Abdomen: Patient reports no abdominal tenderness. Fetal presentation: breech  Introitus: Normal vulva. Normal vagina.  Nitrazine test: positive. Amniotic fluid character: clear.  Pelvis: adequate for delivery.      Fetal Exam Fetal Monitor Review: Mode: ultrasound.   Variability: moderate (6-25 bpm).   Pattern: accelerations present and no decelerations.    Fetal State Assessment: Category I - tracings are normal.     Physical Exam  Vitals reviewed. Constitutional: She appears well-developed and well-nourished.  Cardiovascular: Normal rate and regular rhythm.   Respiratory: Effort normal. No respiratory distress.  GI: Soft.    Prenatal labs: ABO, Rh: --/--/O POS (10/09 2100) Antibody:  Neg Rubells:  Immune RPR:   NR HBsAg:   Neg HIV:   Neg GBS:   Not done  Assessment/Plan: IUP at 34+ weeks with PPROM, breech presentation.  She did receive 1st betamethasone about 2 hours ago.  Discussed option of c-section  now or waiting 48 hours for steroids to have optimal effect.  Risk of waiting is infection or labor, risk of delivery now is slight increased risk of pulmonary immaturity.  She wants to proceed with c-section now.  Discussed c-section procedure and risks, OR notified.     Blane Ohara Takari Lundahl 10/04/2017, 9:51 PM

## 2017-10-04 NOTE — Anesthesia Procedure Notes (Signed)
Spinal  Patient location during procedure: OR Start time: 10/04/2017 10:10 AM End time: 10/04/2017 10:20 AM Staffing Anesthesiologist: Adele Barthel P Performed: anesthesiologist  Preanesthetic Checklist Completed: patient identified, surgical consent, pre-op evaluation, timeout performed, IV checked, risks and benefits discussed and monitors and equipment checked Spinal Block Patient position: sitting Prep: DuraPrep Patient monitoring: cardiac monitor, continuous pulse ox and blood pressure Approach: left paramedian Location: L4-5 Injection technique: single-shot Needle Needle type: Pencan  Needle gauge: 24 G Needle length: 9 cm Assessment Sensory level: T10 Additional Notes Functioning IV was confirmed and monitors were applied. Sterile prep and drape, including hand hygiene and sterile gloves were used. The patient was positioned and the spine was prepped. The skin was anesthetized with lidocaine.  Free flow of clear CSF was obtained prior to injecting local anesthetic into the CSF.  The spinal needle aspirated freely following injection.  The needle was carefully withdrawn.  The patient tolerated the procedure well.

## 2017-10-04 NOTE — MAU Note (Signed)
Patient states was at [place] checking out and had a big gush of clear fluid around 515 this evening.   Occasional cramping  States did notices some bleeding when she went to the bathroom.  Denies any complications with this pregnancy. Does state she has a heart shaped uterus.   +FM

## 2017-10-05 ENCOUNTER — Encounter (HOSPITAL_COMMUNITY): Payer: Self-pay | Admitting: Obstetrics and Gynecology

## 2017-10-05 DIAGNOSIS — Z98891 History of uterine scar from previous surgery: Secondary | ICD-10-CM

## 2017-10-05 LAB — ABO/RH: ABO/RH(D): O POS

## 2017-10-05 LAB — CBC
HCT: 29.8 % — ABNORMAL LOW (ref 36.0–46.0)
HEMOGLOBIN: 10.1 g/dL — AB (ref 12.0–15.0)
MCH: 30.1 pg (ref 26.0–34.0)
MCHC: 33.9 g/dL (ref 30.0–36.0)
MCV: 88.7 fL (ref 78.0–100.0)
PLATELETS: 218 10*3/uL (ref 150–400)
RBC: 3.36 MIL/uL — AB (ref 3.87–5.11)
RDW: 13 % (ref 11.5–15.5)
WBC: 14.1 10*3/uL — AB (ref 4.0–10.5)

## 2017-10-05 LAB — RPR: RPR Ser Ql: NONREACTIVE

## 2017-10-05 MED ORDER — KETOROLAC TROMETHAMINE 30 MG/ML IJ SOLN
30.0000 mg | Freq: Once | INTRAMUSCULAR | Status: AC
  Administered 2017-10-05: 30 mg via INTRAMUSCULAR

## 2017-10-05 MED ORDER — LACTATED RINGERS IV SOLN
INTRAVENOUS | Status: DC
  Administered 2017-10-05: 02:00:00 via INTRAVENOUS

## 2017-10-05 MED ORDER — SIMETHICONE 80 MG PO CHEW
80.0000 mg | CHEWABLE_TABLET | ORAL | Status: DC | PRN
  Administered 2017-10-06: 80 mg via ORAL
  Filled 2017-10-05: qty 1

## 2017-10-05 MED ORDER — LEVOTHYROXINE SODIUM 175 MCG PO TABS
175.0000 ug | ORAL_TABLET | ORAL | Status: DC
  Administered 2017-10-08: 175 ug via ORAL
  Filled 2017-10-05 (×2): qty 1

## 2017-10-05 MED ORDER — IBUPROFEN 600 MG PO TABS
600.0000 mg | ORAL_TABLET | Freq: Four times a day (QID) | ORAL | Status: DC
  Administered 2017-10-05 – 2017-10-08 (×13): 600 mg via ORAL
  Filled 2017-10-05 (×14): qty 1

## 2017-10-05 MED ORDER — DIPHENHYDRAMINE HCL 25 MG PO CAPS
25.0000 mg | ORAL_CAPSULE | Freq: Four times a day (QID) | ORAL | Status: DC | PRN
  Administered 2017-10-05: 25 mg via ORAL
  Filled 2017-10-05: qty 1

## 2017-10-05 MED ORDER — TETANUS-DIPHTH-ACELL PERTUSSIS 5-2.5-18.5 LF-MCG/0.5 IM SUSP: 0.5000 mL | Freq: Once | INTRAMUSCULAR | Status: DC

## 2017-10-05 MED ORDER — ZOLPIDEM TARTRATE 5 MG PO TABS: 5.0000 mg | ORAL_TABLET | Freq: Every evening | ORAL | Status: DC | PRN

## 2017-10-05 MED ORDER — OXYTOCIN 40 UNITS IN LACTATED RINGERS INFUSION - SIMPLE MED: 2.5000 [IU]/h | INTRAVENOUS | Status: AC

## 2017-10-05 MED ORDER — ACETAMINOPHEN 325 MG PO TABS
650.0000 mg | ORAL_TABLET | ORAL | Status: DC | PRN
  Administered 2017-10-07: 650 mg via ORAL
  Filled 2017-10-05: qty 2

## 2017-10-05 MED ORDER — OXYCODONE HCL 5 MG PO TABS: 5.0000 mg | ORAL_TABLET | Freq: Once | ORAL | Status: DC | PRN

## 2017-10-05 MED ORDER — COCONUT OIL OIL
1.0000 "application " | TOPICAL_OIL | Status: DC | PRN
  Filled 2017-10-05: qty 120

## 2017-10-05 MED ORDER — PRENATAL MULTIVITAMIN CH
1.0000 | ORAL_TABLET | Freq: Every day | ORAL | Status: DC
  Administered 2017-10-05 – 2017-10-08 (×4): 1 via ORAL
  Filled 2017-10-05 (×4): qty 1

## 2017-10-05 MED ORDER — KETOROLAC TROMETHAMINE 30 MG/ML IJ SOLN
INTRAMUSCULAR | Status: AC
  Filled 2017-10-05: qty 1

## 2017-10-05 MED ORDER — HYDROMORPHONE HCL 1 MG/ML IJ SOLN: 0.2500 mg | INTRAMUSCULAR | Status: DC | PRN

## 2017-10-05 MED ORDER — OXYCODONE HCL 5 MG PO TABS: 10.0000 mg | ORAL_TABLET | ORAL | Status: DC | PRN

## 2017-10-05 MED ORDER — DIBUCAINE 1 % RE OINT: 1.0000 "application " | TOPICAL_OINTMENT | RECTAL | Status: DC | PRN

## 2017-10-05 MED ORDER — MENTHOL 3 MG MT LOZG: 1.0000 | LOZENGE | OROMUCOSAL | Status: DC | PRN

## 2017-10-05 MED ORDER — LEVOTHYROXINE SODIUM 200 MCG PO TABS
200.0000 ug | ORAL_TABLET | ORAL | Status: DC
  Administered 2017-10-05 – 2017-10-07 (×3): 200 ug via ORAL
  Filled 2017-10-05 (×3): qty 1

## 2017-10-05 MED ORDER — SENNOSIDES-DOCUSATE SODIUM 8.6-50 MG PO TABS
2.0000 | ORAL_TABLET | ORAL | Status: DC
  Administered 2017-10-05 – 2017-10-08 (×3): 2 via ORAL
  Filled 2017-10-05 (×3): qty 2

## 2017-10-05 MED ORDER — MEASLES, MUMPS & RUBELLA VAC ~~LOC~~ INJ
0.5000 mL | INJECTION | Freq: Once | SUBCUTANEOUS | Status: DC
  Filled 2017-10-05: qty 0.5

## 2017-10-05 MED ORDER — OXYCODONE HCL 5 MG/5ML PO SOLN: 5.0000 mg | Freq: Once | ORAL | Status: DC | PRN

## 2017-10-05 MED ORDER — MAGNESIUM HYDROXIDE 400 MG/5ML PO SUSP: 30.0000 mL | ORAL | Status: DC | PRN

## 2017-10-05 MED ORDER — WITCH HAZEL-GLYCERIN EX PADS: 1.0000 "application " | MEDICATED_PAD | CUTANEOUS | Status: DC | PRN

## 2017-10-05 MED ORDER — OXYCODONE HCL 5 MG PO TABS: 5.0000 mg | ORAL_TABLET | ORAL | Status: DC | PRN

## 2017-10-05 MED ORDER — PROMETHAZINE HCL 25 MG/ML IJ SOLN: 6.2500 mg | INTRAMUSCULAR | Status: DC | PRN

## 2017-10-05 NOTE — Progress Notes (Signed)
Subjective: Postpartum Day 1: Cesarean Delivery Patient reports incisional pain and tolerating PO.    Objective: Vital signs in last 24 hours: Temp:  [98 F (36.7 C)-98.8 F (37.1 C)] 98.2 F (36.8 C) (10/10 0427) Pulse Rate:  [50-78] 50 (10/10 0427) Resp:  [12-20] 18 (10/10 0427) BP: (102-124)/(65-94) 103/68 (10/10 0427) SpO2:  [99 %-100 %] 100 % (10/10 0427) Weight:  [72.1 kg (159 lb)] 72.1 kg (159 lb) (10/09 2143)  Physical Exam:  General: alert and no distress Lochia: appropriate Uterine Fundus: firm Incision: healing well DVT Evaluation: No evidence of DVT seen on physical exam.   Recent Labs  10/04/17 2100 10/05/17 0516  HGB 11.6* 10.1*  HCT 34.0* 29.8*    Assessment/Plan: Status post Cesarean section. Doing well postoperatively.  Continue current care.  Emily Smith 10/05/2017, 7:37 AM

## 2017-10-05 NOTE — Lactation Note (Signed)
This note was copied from a baby's chart. Lactation Consultation Note: infant is 70 hours old and is in the NICU for prematurity. Infant is 34.3 weeks,.Mother was given NICU brochure and [facility] brochure with information on all [place] services. Mother reports that she breastfeed her first child for one year.   Mother reports that she breastfeed infant for 30 mins and that infants heart rate went up and infant became very tired. Mother reports that staff will tube feed infant next feeding.  Assist mother with hand expression. Obtained 3-4 ml of colostrum in colostrum container. Mother was given yellow colostrum dots and breastmilk labels. Mother to continue to do frequent skin to skin.  Advised mother to pump every 2-3 hours for 15 -20 mins. Reviewed collection, cleaning and storing of expressed breastmilk. Mother receptive to all teaching. Mother reports that she has a pump at home from her insurance company. Mother to follow up with [facility] as needed.  Patient Name: Emily Smith [id number] Date: 10/05/2017 Reason for consult: Initial assessment   Maternal Data Has patient been taught Hand Expression?: Yes Does the patient have breastfeeding experience prior to this delivery?: Yes  Feeding Feeding Type: Formula Length of feed: 30 min  LATCH Score                   Interventions Interventions: Breast feeding basics reviewed;Skin to skin;Breast massage;Hand express;Expressed milk  Lactation Tools Discussed/Used     Consult Status Consult Status: Follow-up Date: 10/06/17 Follow-up type: In-patient    Jess Barters [facility] 10/05/2017, 3:51 PM

## 2017-10-05 NOTE — Anesthesia Postprocedure Evaluation (Signed)
Anesthesia Post Note  Patient: [name]  Procedure(s) Performed: CESAREAN SECTION (N/A )     Patient location during evaluation: PACU Anesthesia Type: Spinal Level of consciousness: oriented and awake and alert Pain management: pain level controlled Vital Signs Assessment: post-procedure vital signs reviewed and stable Respiratory status: spontaneous breathing, respiratory function stable and patient connected to nasal cannula oxygen Cardiovascular status: blood pressure returned to baseline and stable Postop Assessment: no headache, no backache and no apparent nausea or vomiting Anesthetic complications: no    Last Vitals:  Vitals:   10/05/17 0056 10/05/17 0159  BP: 118/77 120/70  Pulse: 66 61  Resp: 18 18  Temp: 36.7 C   SpO2: 100% 100%    Last Pain:  Vitals:   10/05/17 0200  TempSrc:   PainSc: 2    Pain Goal:                 Karyl Kinnier Daeron Carreno

## 2017-10-06 LAB — CULTURE, BETA STREP (GROUP B ONLY)

## 2017-10-06 NOTE — Progress Notes (Signed)
Subjective: Postpartum Day 2: Cesarean Delivery Patient reports tolerating PO and no problems voiding.  Pain controlled with ibuprofen thus far, but a bit more sore today  Objective: Vital signs in last 24 hours: Temp:  [98.2 F (36.8 C)-98.5 F (36.9 C)] 98.3 F (36.8 C) (10/11 0800) Pulse Rate:  [64-67] 67 (10/11 0800) Resp:  [16-17] 16 (10/11 0800) BP: (93-100)/(47-59) 96/59 (10/11 0800) SpO2:  [99 %-100 %] 99 % (10/11 0335)  Physical Exam:  General: alert and cooperative Lochia: appropriate Uterine Fundus: firm Incision:c/d/i    Recent Labs  10/04/17 2100 10/05/17 0516  HGB 11.6* 10.1*  HCT 34.0* 29.8*    Assessment/Plan: Status post Cesarean section. Doing well postoperatively.  Continue current care, possible d/c tomorrow.  Working on pumping and baby stable on RA, tube feeds  Logan Bores 10/06/2017, 8:40 AM

## 2017-10-06 NOTE — Lactation Note (Signed)
This note was copied from a baby's chart. Lactation Consultation Note  Patient Name: Emily Smith [id number] Date: 10/06/2017 Reason for consult: Follow-up assessment;Maternal endocrine disorder Type of Endocrine Disorder?: Thyroid (Thyroidectomy.)  NICU baby 76 hours old. Mom has about 50 ml of EBM in room that she is about to take to NICU. Mom give additional larger bottles for EBM collection. Mom reports that she has a DEBP at home. Mom aware of pumping rooms in NICU, and enc to take pumping kit with her at D/C. Mom aware of OP/BFSG and [place] phone line assistance after D/C.   Maternal Data    Feeding    LATCH Score                   Interventions    Lactation Tools Discussed/Used     Consult Status Consult Status: PRN    Andres Labrum 10/06/2017, 10:00 AM

## 2017-10-06 NOTE — Progress Notes (Signed)
Patient screened out for psychosocial assessment since none of the following apply:  Psychosocial stressors documented in mother or baby's chart  Gestation less than 32 weeks  Code at delivery   Infant with anomalies Please contact the Clinical Social Worker if specific needs arise, or by MOB's request.   Laurey Arrow, MSW, LCSW Clinical Social Work [phone]

## 2017-10-07 MED ORDER — BUTALBITAL-APAP-CAFFEINE 50-325-40 MG PO TABS
1.0000 | ORAL_TABLET | Freq: Four times a day (QID) | ORAL | Status: DC | PRN
Start: 2017-10-07 — End: 2017-10-09

## 2017-10-07 NOTE — Progress Notes (Signed)
Anesthesia Headache Assessment Pt reports severe headache around 3pm that seemed worse when sitting then when reclined. She is currently sitting up comfortably speaking with friends and family in the room. She has been eating well, but, per mom, has not been taking much fluid. She has been back and forth to the nursery multiple times today and ambulating without issue. She said her headache wasn't bothering her during ambulation. We discussed PDPH and its treatment. She is not interested in an EBP at this time. I discussed pushing fluids and caffeine and she was enthusiastic about trying these conservative measures. I prescribed Fioricet although she doesn't feel she needs it at this point. I will check on her tomorrow and see how she's doing.  Emily Smith

## 2017-10-07 NOTE — Addendum Note (Signed)
Addendum  created 10/07/17 1757 by Nolon Nations, MD   Order list changed, Sign clinical note

## 2017-10-07 NOTE — Progress Notes (Signed)
Pt complaining of H/A, rating pain 8/10. Unrelieved by tylenol and ibuprofen. BP wnl. Pt states headache gets better when she lays flat and worsens as she sits up. Notified anesthesiology. Anesthesiologist en route to department.

## 2017-10-07 NOTE — Progress Notes (Signed)
POD #3 Doing well, sore shoulders Afeb, VSS Abd- soft, fundus firm, incision intact Continue routine care

## 2017-10-08 MED ORDER — IBUPROFEN 600 MG PO TABS: 600.0000 mg | ORAL_TABLET | Freq: Four times a day (QID) | ORAL | 0 refills | Status: AC

## 2017-10-08 NOTE — Discharge Instructions (Signed)
As per discharge pamphlet °

## 2017-10-08 NOTE — Lactation Note (Signed)
This note was copied from a baby's chart. Lactation Consultation Note  Patient Name: Emily Smith [id number] Date: 10/08/2017 Reason for consult: Follow-up assessment;NICU baby;Infant < 6lbs;Late-preterm 34-36.6wks Type of Endocrine Disorder?: Thyroid   Follow up with mom of 52 hour old NICU infant. Mom is pumping at least every 3 hours and milk is in. She reports her left breast is feeling hard and firm on the outer aspect of the breast. Engorgement treatment reviewed and mom was given ice pack to apply prior to pumping. Enc her to continue using ice packs as home as needed for engorgement.   Mom has been latching infant to the breast and she reports infant is doing well. Enc mom to continue pumping every 2-3 hours. She is to use her Spectra pump at home and was encouraged to pump until empty. Enc mom to take pump tubings and to pump with Symphony pump when visiting infant in the NICU.   Mom with no questions/concerns at this time. Enc mom to call for feeding assistance as needed.    Maternal Data Formula Feeding for Exclusion: No Has patient been taught Hand Expression?: Yes  Feeding Feeding Type: Breast Milk Length of feed: 90 min (Breast fed 15 min)  LATCH Score Latch: Grasps breast easily, tongue down, lips flanged, rhythmical sucking.  Audible Swallowing: Spontaneous and intermittent  Type of Nipple: Everted at rest and after stimulation  Comfort (Breast/Nipple): Soft / non-tender  Hold (Positioning): No assistance needed to correctly position infant at breast.  LATCH Score: 10  Interventions    Lactation Tools Discussed/Used WIC Program: No Pump Review: Setup, frequency, and cleaning;Milk Storage Initiated by:: Reviewed and encouraged every 2-3 houirs   Consult Status Consult Status: PRN Follow-up type: Call as needed    Donn Pierini 10/08/2017, 11:32 AM

## 2017-10-08 NOTE — Progress Notes (Signed)
POD #4 Doing ok, had headache off and on yesterday, ok so far today.  Baby stable in NICU Afeb, VSS Abd- soft, fundus firm, incision intact D/c home

## 2017-10-08 NOTE — Discharge Summary (Signed)
OB Discharge Summary     Patient Name: Emily Smith DOB: [birth year] MRN: [id number]  Date of admission: 10/04/2017 Delivering MD: Willis Modena, Ena Demary   Date of discharge: 10/08/2017  Admitting diagnosis: 34wks Water broke CTX 5 to 63mins Intrauterine pregnancy: [redacted]w[redacted]d     Secondary diagnosis:  Active Problems:   Preterm premature rupture of membranes (PPROM) delivered, current hospitalization   Breech presentation   Septate uterus   S/P cesarean section      Discharge diagnosis: Preterm Pregnancy Delivered and PPROM, breech                                                                                                Hospital course:  Onset of Labor With Unplanned C/S  31 y.o. yo [id number] at [redacted]w[redacted]d was admitted in Latent Labor with PPROM at 34 weeks on 10/04/2017. Patient had a labor course significant for breech presentation. Membrane Rupture Time/Date: 5:15 PM ,10/04/2017  .  She received one dose of betamethasone and chose to proceed with immediate c-section. The patient went for cesarean section due to Malpresentation and PPROM, and delivered a Viable infant,10/04/2017  Details of operation can be found in separate operative note. Patient had an uncomplicated postpartum course except for a headache on POD #3.  She is ambulating,tolerating a regular diet, passing flatus, and urinating well.  Patient is discharged home in stable condition 10/08/17.  Physical exam  Vitals:   10/07/17 1437 10/07/17 1620 10/07/17 2048 10/08/17 0856  BP: 116/76 117/73 120/78 117/79  Pulse: 81 68 64 72  Resp:  18 18 18   Temp:  98.6 F (37 C) 98 F (36.7 C)   TempSrc:  Oral Oral   SpO2:  100% 100% 100%  Weight:      Height:       General: alert Lochia: appropriate Uterine Fundus: firm Incision: Healing well with no significant drainage  Labs:  Lab Results  Component Value Date   WBC 14.1 (H) 10/05/2017   HGB 10.1 (L) 10/05/2017   HCT 29.8 (L) 10/05/2017   MCV 88.7 10/05/2017   PLT 218  10/05/2017   CMP Latest Ref Rng & Units 07/12/2016  Glucose 65 - 99 mg/dL 80  BUN 6 - 20 mg/dL 11  Creatinine 0.57 - 1.00 mg/dL 0.84  Sodium 134 - 144 mmol/L 141  Potassium 3.5 - 5.2 mmol/L 4.8  Chloride 96 - 106 mmol/L 101  CO2 18 - 29 mmol/L 20  Calcium 8.7 - 10.2 mg/dL 8.9  Total Protein 6.0 - 8.5 g/dL 6.3  Total Bilirubin 0.0 - 1.2 mg/dL 0.5  Alkaline Phos 39 - 117 IU/L 40  AST 0 - 40 IU/L 17  ALT 0 - 32 IU/L 12    Discharge instruction: per After Visit Summary and "Baby and Me Booklet".  After visit meds:  Allergies as of 10/08/2017      Reactions   Sulfa Antibiotics Hives, Rash   Severe rash      Medication List    STOP taking these medications   etonogestrel-ethinyl estradiol 0.12-0.015 MG/24HR vaginal ring Commonly known as:  NUVARING  TAKE these medications   amphetamine-dextroamphetamine 20 MG tablet Commonly known as:  ADDERALL Take 1 tablet (20 mg total) by mouth 2 (two) times daily.   ibuprofen 600 MG tablet Commonly known as:  ADVIL,MOTRIN Take 1 tablet (600 mg total) by mouth every 6 (six) hours.   levothyroxine 175 MCG tablet Commonly known as:  SYNTHROID, LEVOTHROID Take 175 mcg by mouth. Saturday and Sunday ONLY What changed:  Another medication with the same name was changed. Make sure you understand how and when to take each.   levothyroxine 200 MCG tablet Commonly known as:  SYNTHROID, LEVOTHROID Take 1 tablet (200 mcg total) by mouth daily before breakfast. What changed:  when to take this  additional instructions   Prenatal Vitamins 0.8 MG tablet Take 1 tablet by mouth daily.       Diet: routine diet  Activity: Advance as tolerated. Pelvic rest for 6 weeks.   Outpatient follow up:2 weeks   Newborn Data: Live born female  Birth Weight: 4 lb 11.5 oz (2140 g) APGAR: 68, 9  Newborn Delivery   Birth date/time:  10/04/2017 22:38:00 Delivery type:  C-Section, Low Transverse  C-section categorization:  Primary     Baby  Feeding: Breast Disposition:NICU   10/08/2017 Clarene Duke, MD

## 2018-01-30 ENCOUNTER — Ambulatory Visit: Payer: 59 | Admitting: Nurse Practitioner

## 2018-02-27 ENCOUNTER — Other Ambulatory Visit: Payer: Self-pay | Admitting: Nurse Practitioner

## 2018-02-27 MED ORDER — FLUCONAZOLE 150 MG PO TABS: ORAL_TABLET | ORAL | 0 refills | Status: AC

## 2018-05-14 ENCOUNTER — Other Ambulatory Visit: Payer: Self-pay | Admitting: Nurse Practitioner

## 2018-05-14 MED ORDER — PREDNISONE 10 MG (21) PO TBPK: ORAL_TABLET | ORAL | 0 refills | Status: AC

## 2018-07-22 ENCOUNTER — Emergency Department (HOSPITAL_BASED_OUTPATIENT_CLINIC_OR_DEPARTMENT_OTHER)
Admission: EM | Admit: 2018-07-22 | Discharge: 2018-07-22 | Disposition: A | Discharge status: Home / Self Care | Payer: 59 | Attending: Emergency Medicine | Admitting: Emergency Medicine

## 2018-07-22 ENCOUNTER — Encounter (HOSPITAL_BASED_OUTPATIENT_CLINIC_OR_DEPARTMENT_OTHER): Payer: Self-pay | Admitting: Emergency Medicine

## 2018-07-22 ENCOUNTER — Other Ambulatory Visit: Payer: Self-pay

## 2018-07-22 DIAGNOSIS — Z79899 Other long term (current) drug therapy: Secondary | ICD-10-CM | POA: Insufficient documentation

## 2018-07-22 DIAGNOSIS — E039 Hypothyroidism, unspecified: Secondary | ICD-10-CM | POA: Diagnosis not present

## 2018-07-22 DIAGNOSIS — R51 Headache: Secondary | ICD-10-CM | POA: Diagnosis not present

## 2018-07-22 DIAGNOSIS — R519 Headache, unspecified: Secondary | ICD-10-CM

## 2018-07-22 DIAGNOSIS — Z87891 Personal history of nicotine dependence: Secondary | ICD-10-CM | POA: Diagnosis not present

## 2018-07-22 LAB — PREGNANCY, URINE: Preg Test, Ur: NEGATIVE

## 2018-07-22 MED ORDER — KETOROLAC TROMETHAMINE 15 MG/ML IJ SOLN
15.0000 mg | Freq: Once | INTRAMUSCULAR | Status: AC
  Administered 2018-07-22: 15 mg via INTRAVENOUS
  Filled 2018-07-22: qty 1

## 2018-07-22 MED ORDER — DIPHENHYDRAMINE HCL 50 MG/ML IJ SOLN
12.5000 mg | Freq: Once | INTRAMUSCULAR | Status: AC
  Administered 2018-07-22: 12.5 mg via INTRAVENOUS
  Filled 2018-07-22: qty 1

## 2018-07-22 MED ORDER — LACTATED RINGERS IV BOLUS
1000.0000 mL | Freq: Once | INTRAVENOUS | Status: AC
  Administered 2018-07-22: 1000 mL via INTRAVENOUS

## 2018-07-22 MED ORDER — METOCLOPRAMIDE HCL 5 MG/ML IJ SOLN
10.0000 mg | Freq: Once | INTRAMUSCULAR | Status: AC
  Administered 2018-07-22: 10 mg via INTRAVENOUS
  Filled 2018-07-22: qty 2

## 2018-07-22 NOTE — ED Provider Notes (Signed)
[facility] Provider Note   CSN: [id number] Arrival date & time: 07/22/18  1723     History   Chief Complaint Chief Complaint  Patient presents with  . Headache    HPI Emily Smith is a 32 y.o. female.  HPI   32yF with headache. Woke up this morning in usual state of health. Developed later in the morning. HA is in b/l frontal region. Denies trauma. Constant and progressively worsening through out day. Associated nausea and photophobia.Hasn't noticed any appreciable exacerbating or relieving factors otherwise.  No fever or chills. No neck pain or stiffness. No change in speech. No confusion. Denies significant headache history.  No lacrimation or rhinorrhea. Takes OCP. Has infant but delivered 9+ months ago.   Past Medical History:  Diagnosis Date  . Hoarseness    began 03/11/2012; associated w/ mild sore throat  . Mood changes    takes zoloft  . Seizures ([place])    as teenager ; none since  . Squamous cell skin cancer, thigh 2012   left  . Thyroid cancer [facility]) 2013    Patient Active Problem List   Diagnosis Date Noted  . S/P cesarean section 10/05/2017  . Preterm premature rupture of membranes (PPROM) delivered, current hospitalization 10/04/2017  . Breech presentation 10/04/2017  . Septate uterus 10/04/2017  . Status post thyroidectomy 02/28/2017  . Hypothyroidism 02/28/2017  . ADHD (attention deficit hyperactivity disorder) 03/05/2015    Past Surgical History:  Procedure Laterality Date  . CESAREAN SECTION N/A 10/04/2017   Procedure: CESAREAN SECTION;  Surgeon: Cheri Fowler, MD;  Location: [place];  Service: Obstetrics;  Laterality: N/A;  . NODE DISSECTION  03/22/2012   Procedure: NODE DISSECTION;  Surgeon: Izora Gala, MD;  Location: [place];  Service: ENT;  Laterality: N/A;  Central Node Dissection  . throidectomy  03/22/2012  . THYROIDECTOMY  03/22/2012   Procedure: THYROIDECTOMY;  Surgeon: Izora Gala, MD;  Location:  [place];  Service: ENT;  Laterality: Bilateral;  Total Thyroidectomy     OB History    Gravida  2   Para  2   Term  1   Preterm  1   AB      Living  2     SAB      TAB      Ectopic      Multiple  0   Live Births  1            Home Medications    Prior to Admission medications   Medication Sig Start Date End Date Taking? Authorizing Provider  lisdexamfetamine (VYVANSE) 20 MG capsule Take 20 mg by mouth daily.   Yes [provider]  norethindrone-ethinyl estradiol (JUNEL FE,GILDESS FE,LOESTRIN FE) 1-20 MG-MCG tablet Take 1 tablet by mouth daily.   Yes [provider]  amphetamine-dextroamphetamine (ADDERALL) 20 MG tablet Take 1 tablet (20 mg total) by mouth 2 (two) times daily. Patient not taking: Reported on 10/04/2017 02/28/17   Dettinger, Fransisca Kaufmann, MD  fluconazole (DIFLUCAN) 150 MG tablet 1 po q week x 4 weeks 02/27/18   Hassell Done, Mary-Margaret, FNP  ibuprofen (ADVIL,MOTRIN) 600 MG tablet Take 1 tablet (600 mg total) by mouth every 6 (six) hours. 10/08/17   Meisinger, Todd, MD  levothyroxine (SYNTHROID, LEVOTHROID) 175 MCG tablet Take 175 mcg by mouth. Saturday and Sunday ONLY    [provider]  levothyroxine (SYNTHROID, LEVOTHROID) 200 MCG tablet Take 1 tablet (200 mcg total) by mouth daily before  breakfast. Patient taking differently: Take 200 mcg by mouth. Monday - Friday 06/11/15   Lodema Pilot, PA-C  predniSONE (STERAPRED UNI-PAK 21 TAB) 10 MG (21) TBPK tablet As directed x 6 days 05/14/18   Chevis Pretty, FNP  Prenatal Multivit-Min-Fe-FA (PRENATAL VITAMINS) 0.8 MG tablet Take 1 tablet by mouth daily. 03/31/17   Chevis Pretty, FNP    Family History Family History  Problem Relation Age of [place]set  . Thyroid cancer Mother   . Lung cancer Paternal Grandfather   . Anesthesia problems Neg Hx     Social History Social History   Tobacco Use  . Smoking status: Former Smoker    Years: 5.00    Types: Cigarettes    Last  attempt to quit: 12/27/2010    Years since quitting: 7.5  . Smokeless tobacco: Never Used  . Tobacco comment: Socially   Substance Use Topics  . Alcohol use: Yes    Alcohol/week: 0.6 oz    Types: 1 Glasses of wine per week    Comment: occasional; not while preg  . Drug use: No     Allergies   Sulfa antibiotics   Review of Systems Review of Systems  All systems reviewed and negative, other than as noted in HPI.  Physical Exam Updated Vital Signs BP 114/64 (BP Location: Right Arm)   Pulse 69   Temp 98.7 F (37.1 C) (Oral)   Resp 18   Ht 5\' 6"  (1.676 m)   Wt 69.9 kg (154 lb)   LMP 07/14/2018   SpO2 100%   BMI 24.86 kg/m   Physical Exam  Constitutional: She is oriented to person, place, and time. She appears well-developed and well-nourished. No distress.  HENT:  Head: Normocephalic and atraumatic.  Eyes: Conjunctivae are normal. Right eye exhibits no discharge. Left eye exhibits no discharge.  Neck: Neck supple.  No nuchal rigidity  Cardiovascular: Normal rate, regular rhythm and normal heart sounds. Exam reveals no gallop and no friction rub.  No murmur heard. Pulmonary/Chest: Effort normal and breath sounds normal. No respiratory distress.  Abdominal: Soft. She exhibits no distension. There is no tenderness.  Musculoskeletal: She exhibits no edema or tenderness.  Neurological: She is alert and oriented to person, place, and time. No cranial nerve deficit. She exhibits normal muscle tone. Coordination normal.  Skin: Skin is warm and dry.  Psychiatric: She has a normal mood and affect. Her behavior is normal. Thought content normal.  Nursing note and vitals reviewed.    ED Treatments / Results  Labs (all labs ordered are listed, but only abnormal results are displayed) Labs Reviewed  PREGNANCY, URINE    EKG None  Radiology No results found.  Procedures Procedures (including critical care time)  Medications Ordered in ED Medications  lactated ringers  bolus 1,000 mL (1,000 mLs Intravenous New Bag/Given 07/22/18 1949)  ketorolac (TORADOL) 15 MG/ML injection 15 mg (15 mg Intravenous Given 07/22/18 2010)  metoCLOPramide (REGLAN) injection 10 mg (10 mg Intravenous Given 07/22/18 2015)  diphenhydrAMINE (BENADRYL) injection 12.5 mg (12.5 mg Intravenous Given 07/22/18 2009)     Initial Impression / Assessment and Plan / ED Course  I have reviewed the triage vital signs and the nursing notes.  Pertinent labs & imaging results that were available during my care of the patient were reviewed by me and considered in my medical decision making (see chart for details).     32yF with headache. Concerning features would be lack of significant headache history and OCP usage. More  reassuring that not thunder clap. No trauma. Afebrile. No acute neurological complaints and nonfocal neurological exam. No neck pain and no nuchal rigidity. Not anticoagulated.   Symptoms completely resolved after medications.  She has no further complaints.  Low suspicion for emergent process at this time.  Return precautions were discussed.  Final Clinical Impressions(s) / ED Diagnoses   Final diagnoses:  Acute nonintractable headache, unspecified headache type    ED Discharge Orders    None       Virgel Manifold, MD 07/28/18 1730

## 2018-07-22 NOTE — ED Notes (Signed)
Pt c/o H/A, N/V since this a.m. Has not vomited since 4p. H/A worse when standing "starts to throb". Neuro WNL. Lights dimmed for pt comfort.

## 2018-07-22 NOTE — ED Notes (Signed)
Pt and family given d/c instructions as per chart. Verbalizes understanding. No questions.

## 2018-07-22 NOTE — ED Triage Notes (Signed)
Headache with N/V since this morning.

## 2018-07-22 NOTE — ED Notes (Signed)
ED Provider at bedside. 

## 2023-04-21 ENCOUNTER — Encounter (HOSPITAL_BASED_OUTPATIENT_CLINIC_OR_DEPARTMENT_OTHER): Payer: Self-pay | Admitting: Emergency Medicine

## 2023-04-21 ENCOUNTER — Emergency Department (HOSPITAL_BASED_OUTPATIENT_CLINIC_OR_DEPARTMENT_OTHER)
Admission: EM | Admit: 2023-04-21 | Discharge: 2023-04-21 | Disposition: A | Discharge status: Home / Self Care | Payer: BC Managed Care – PPO | Attending: Emergency Medicine | Admitting: Emergency Medicine

## 2023-04-21 ENCOUNTER — Other Ambulatory Visit (HOSPITAL_BASED_OUTPATIENT_CLINIC_OR_DEPARTMENT_OTHER): Payer: Self-pay

## 2023-04-21 ENCOUNTER — Other Ambulatory Visit: Payer: Self-pay

## 2023-04-21 DIAGNOSIS — R519 Headache, unspecified: Secondary | ICD-10-CM

## 2023-04-21 LAB — HCG, SERUM, QUALITATIVE: Preg, Serum: NEGATIVE

## 2023-04-21 MED ORDER — PROCHLORPERAZINE EDISYLATE 10 MG/2ML IJ SOLN
10.0000 mg | Freq: Once | INTRAMUSCULAR | Status: AC
  Administered 2023-04-21: 10 mg via INTRAVENOUS
  Filled 2023-04-21: qty 2

## 2023-04-21 MED ORDER — KETOROLAC TROMETHAMINE 15 MG/ML IJ SOLN
15.0000 mg | Freq: Once | INTRAMUSCULAR | Status: AC
  Administered 2023-04-21: 15 mg via INTRAVENOUS
  Filled 2023-04-21: qty 1

## 2023-04-21 MED ORDER — LACTATED RINGERS IV BOLUS
1000.0000 mL | Freq: Once | INTRAVENOUS | Status: AC
  Administered 2023-04-21: 1000 mL via INTRAVENOUS

## 2023-04-21 MED ORDER — DIPHENHYDRAMINE HCL 50 MG/ML IJ SOLN
25.0000 mg | Freq: Once | INTRAMUSCULAR | Status: AC
  Administered 2023-04-21: 25 mg via INTRAVENOUS
  Filled 2023-04-21: qty 1

## 2023-04-21 NOTE — Discharge Instructions (Addendum)
Please continue to hydrate with plenty of fluids to help with your headache.  If you experience any worsening fever, neck pain, worsening symptoms please return to the emergency department.

## 2023-04-21 NOTE — ED Provider Notes (Signed)
[place] EMERGENCY DEPARTMENT AT [facility] Provider Note   CSN: [id number] Arrival date & time: 04/21/23  1601     History  Chief Complaint  Patient presents with   Headache    Emily [facility] is a 37 y.o. female.  37 y.o female with a PMH of Migraine not on medication presents to the ED with a chief complaint of headache x 3 days. [facility] Smith described the pain as sharp beginning at the frontal aspect radiating to the back of her head. [facility] Smith reports having a fever, nausea, vomiting and headache.  The fever has gone away, [facility] continues to have some nausea and vomiting but is no longer having diarrhea.  Em[facility] Smith feels that the headache might have come from dehydration.  Headache is exacerbated with light, endorsing some photophobia, does make it better applying ice packs to the back of her head.  Emily Smith did have a headache 3 years ago it was seen in the emergency department and given a headache cocktail, feels that the headache is similar to this at this time.  No vision changes, no neck rigidity or trauma.   The history is provided by the patient.  Headache Pain location:  Frontal Quality:  Sharp Radiates to:  R neck Severity currently:  10/10 Severity at highest:  10/10 Onset quality:  Gradual Duration:  3 days Timing:  Constant Progression:  Worsening Chronicity:  Recurrent Similar to prior headaches: yes   Context: bright light   Relieved by:  Cold packs Worsened by:  Light Ineffective treatments:  Acetaminophen, NSAIDs and resting in a darkened room Associated symptoms: nausea and photophobia   Associated symptoms: no abdominal pain, no back pain, no dizziness, no fever and no vomiting        Home Medications Prior to Admission medications   Medication Sig Start Date End Date Taking? Authorizing Provider  ondansetron (ZOFRAN-ODT) 8 MG disintegrating tablet Take 8 mg by mouth 3 (three) times daily. 04/21/23 04/28/23 Yes [provider]  amphetamine-dextroamphetamine  (ADDERALL) 20 MG tablet Take 1 tablet (20 mg total) by mouth 2 (two) times daily. Patient not taking: Reported on 10/04/2017 02/28/17   Dettinger, Elige Radon, MD  fluconazole (DIFLUCAN) 150 MG tablet 1 po q week x 4 weeks 02/27/18   Daphine Deutscher, Mary-Margaret, FNP  ibuprofen (ADVIL,MOTRIN) 600 MG tablet Take 1 tablet (600 mg total) by mouth every 6 (six) hours. 10/08/17   Meisinger, Todd, MD  levothyroxine (SYNTHROID, LEVOTHROID) 175 MCG tablet Take 175 mcg by mouth. Saturday and Sunday ONLY    [provider]  levothyroxine (SYNTHROID, LEVOTHROID) 200 MCG tablet Take 1 tablet (200 mcg total) by mouth daily before breakfast. Patient taking differently: Take 200 mcg by mouth. Monday - Friday 06/11/15   Inis Sizer, PA-C  lisdexamfetamine (VYVANSE) 20 MG capsule Take 20 mg by mouth daily.    [provider]  norethindrone-ethinyl estradiol (JUNEL FE,GILDESS FE,LOESTRIN FE) 1-20 MG-MCG tablet Take 1 tablet by mouth daily.    [provider]  predniSONE (STERAPRED UNI-PAK 21 TAB) 10 MG (21) TBPK tablet As directed x 6 days 05/14/18   Bennie Pierini, FNP  Prenatal Multivit-Min-Fe-FA (PRENATAL VITAMINS) 0.8 MG tablet Take 1 tablet by mouth daily. 03/31/17   Daphine Deutscher Mary-Margaret, FNP      Allergies    Sulfa antibiotics    Review of Systems   Review of Systems  Constitutional:  Negative for chills and fever.  Eyes:  Positive for photophobia.  Respiratory:  Negative for shortness of breath.  Cardiovascular:  Negative for chest pain.  Gastrointestinal:  Positive for nausea. Negative for abdominal pain and vomiting.  Genitourinary:  Negative for flank pain.  Musculoskeletal:  Negative for back pain.  Neurological:  Positive for headaches. Negative for dizziness.  All other systems reviewed and are negative.   Physical Exam Updated Vital Signs BP 118/86 (BP Location: Right Arm)   Pulse 74   Temp 98.1 F (36.7 C) (Oral)   Resp 16   Ht 5\' 6"  (1.676 m)   Wt 63 kg    LMP 04/06/2023   SpO2 100%   BMI 22.44 kg/m  Physical Exam Vitals and nursing note reviewed.  Constitutional:      Appearance: Emily [facility] is well-developed.  HENT:     Head: Normocephalic and atraumatic.  Neck:     Meningeal: Brudzinski's sign absent.  Cardiovascular:     Rate and Rhythm: Normal rate.  Pulmonary:     Effort: Pulmonary effort is normal.  Abdominal:     Palpations: Abdomen is soft.  Musculoskeletal:     Cervical back: Normal range of motion and neck supple. No rigidity.  Lymphadenopathy:     Cervical: No cervical adenopathy.  Skin:    General: Skin is warm and dry.  Neurological:     Mental Status: [facility] is alert and oriented to person, place, and time.     Comments: Alert, oriented, thought content appropriate. Speech fluent without evidence of aphasia. Able to follow 2 step commands without difficulty.  Cranial Nerves:  II:  Peripheral visual fields grossly normal, pupils, round, reactive to light III,IV, VI: ptosis not present, extra-ocular motions intact bilaterally  V,VII: smile symmetric, facial light touch sensation equal VIII: hearing grossly normal bilaterally  IX,X: midline uvula rise  XI: bilateral shoulder shrug equal and strong XII: midline tongue extension  Motor:  5/5 in upper and lower extremities bilaterally including strong and equal grip strength and dorsiflexion/plantar flexion Sensory: light touch normal in all extremities.  Cerebellar: normal finger-to-nose with bilateral upper extremities, pronator drift negative Gait: normal gait and balance       ED Results / Procedures / Treatments   Labs (all labs ordered are listed, but only abnormal results are displayed) Labs Reviewed  HCG, SERUM, QUALITATIVE    EKG None  Radiology No results found.  Procedures Procedures    Medications Ordered in ED Medications  ketorolac (TORADOL) 15 MG/ML injection 15 mg (has no administration in time range)  diphenhydrAMINE (BENADRYL) injection  25 mg (25 mg Intravenous Given 04/21/23 1042)  prochlorperazine (COMPAZINE) injection 10 mg (10 mg Intravenous Given 04/21/23 1044)  lactated ringers bolus 1,000 mL (1,000 mLs Intravenous New Bag/Given 04/21/23 1041)    ED Course/ Medical Decision Making/ A&P                             Medical Decision Making Amount and/or Complexity of Data Reviewed Labs: ordered.  Risk Prescription drug management.    Patient here with a bad headache that has been ongoing for the past 3 days.  Did have somewhat of a GI bug earlier with fever, nausea, vomiting and diarrhea.  The diarrhea has not subsided along with the fever however the nausea and vomiting continue.  [facility] does have some photophobia, reports the headache feels the same as it did several years ago when [facility] Smith was in the emergency department and received a headache cocktail.  Emily Smith is try over-the-counter medication without any relief.  Emily Smith has not any vomiting here.  Her neurological exam is unremarkable, Emily Smith is ambulatory with a steady gait.  Arrived to the ED with stable vital signs, no fever noted, no neck rigidity, no meningeal signs to suggest meningitis.  Pregnancy test was obtained, this is negative on today's visit. Given Compazine, Benadryl, bolus with resolution in her headache which went from a 10 to a 2.  We added additional Toradol to help relieve all the symptoms.  [facility] reports resolution in symptoms at this time.  We discussed appropriate follow-up with primary care, Emily Smith will return to the emergency department if [facility] feels that her symptoms have not worsened.  Patient is hemodynamically stable for discharge.   Portions of this note were generated with Scientist, clinical (histocompatibility and immunogenetics). Dictation errors may occur despite best attempts at proofreading.   Final Clinical Impression(s) / ED Diagnoses Final diagnoses:  Bad headache    Rx / DC Orders ED Discharge Orders     None         Claude Manges, PA-C 04/21/23 1151    Margarita Grizzle,  MD 04/21/23 1651

## 2023-04-21 NOTE — ED Triage Notes (Signed)
Pt arrives pov, steady gait, c/o HA with n/v x 3 days with photo sensitivity. Also endorses posterior neck pain. Endorses ibuprofen 800 mg at 0500 today

## 2023-04-21 NOTE — ED Notes (Signed)
Discharge paperwork given and verbally understood.
